# Patient Record
Sex: Female | Born: 1955 | Race: White | Hispanic: No | Smoking: Current every day smoker
Health system: Southern US, Community
[De-identification: ages and names within clinical notes are randomized; demographics above are authoritative.]

## PROBLEM LIST (undated history)

## (undated) DIAGNOSIS — E119 Type 2 diabetes mellitus without complications: Secondary | ICD-10-CM

## (undated) DIAGNOSIS — I10 Essential (primary) hypertension: Secondary | ICD-10-CM

## (undated) DIAGNOSIS — K219 Gastro-esophageal reflux disease without esophagitis: Secondary | ICD-10-CM

## (undated) DIAGNOSIS — N281 Cyst of kidney, acquired: Secondary | ICD-10-CM

## (undated) DIAGNOSIS — E049 Nontoxic goiter, unspecified: Secondary | ICD-10-CM

## (undated) DIAGNOSIS — N189 Chronic kidney disease, unspecified: Secondary | ICD-10-CM

## (undated) DIAGNOSIS — R0600 Dyspnea, unspecified: Secondary | ICD-10-CM

## (undated) DIAGNOSIS — C73 Malignant neoplasm of thyroid gland: Secondary | ICD-10-CM

## (undated) DIAGNOSIS — E039 Hypothyroidism, unspecified: Secondary | ICD-10-CM

## (undated) DIAGNOSIS — I509 Heart failure, unspecified: Secondary | ICD-10-CM

## (undated) DIAGNOSIS — J45909 Unspecified asthma, uncomplicated: Secondary | ICD-10-CM

## (undated) DIAGNOSIS — C55 Malignant neoplasm of uterus, part unspecified: Secondary | ICD-10-CM

## (undated) DIAGNOSIS — B029 Zoster without complications: Secondary | ICD-10-CM

## (undated) HISTORY — PX: COLONOSCOPY: SHX174

## (undated) HISTORY — PX: ABDOMINAL HYSTERECTOMY: SHX81

## (undated) HISTORY — PX: THYROIDECTOMY: SHX17

---

## 2006-09-17 ENCOUNTER — Ambulatory Visit: Payer: Self-pay | Admitting: Emergency Medicine

## 2010-02-17 ENCOUNTER — Ambulatory Visit: Payer: Self-pay | Admitting: Internal Medicine

## 2010-07-14 ENCOUNTER — Ambulatory Visit: Payer: Self-pay

## 2010-11-12 ENCOUNTER — Ambulatory Visit: Payer: Self-pay | Admitting: Family Medicine

## 2012-01-27 DIAGNOSIS — K219 Gastro-esophageal reflux disease without esophagitis: Secondary | ICD-10-CM | POA: Insufficient documentation

## 2012-01-27 DIAGNOSIS — I10 Essential (primary) hypertension: Secondary | ICD-10-CM

## 2012-01-27 DIAGNOSIS — Z720 Tobacco use: Secondary | ICD-10-CM | POA: Insufficient documentation

## 2012-02-29 DIAGNOSIS — E7800 Pure hypercholesterolemia, unspecified: Secondary | ICD-10-CM | POA: Insufficient documentation

## 2012-03-10 DIAGNOSIS — E041 Nontoxic single thyroid nodule: Secondary | ICD-10-CM | POA: Insufficient documentation

## 2012-08-08 ENCOUNTER — Ambulatory Visit: Payer: Self-pay | Admitting: Otolaryngology

## 2012-08-08 LAB — CREATININE, SERUM
Creatinine: 0.76 mg/dL (ref 0.60–1.30)
EGFR (African American): >60
EGFR (Non-African Amer.): >60

## 2014-12-10 ENCOUNTER — Ambulatory Visit
Admission: EM | Admit: 2014-12-10 | Discharge: 2014-12-10 | Disposition: A | Discharge status: Home / Self Care | Payer: BLUE CROSS/BLUE SHIELD | Attending: Family Medicine | Admitting: Family Medicine

## 2014-12-10 DIAGNOSIS — Z8542 Personal history of malignant neoplasm of other parts of uterus: Secondary | ICD-10-CM

## 2014-12-10 DIAGNOSIS — J45909 Unspecified asthma, uncomplicated: Secondary | ICD-10-CM

## 2014-12-10 DIAGNOSIS — J0100 Acute maxillary sinusitis, unspecified: Secondary | ICD-10-CM | POA: Diagnosis not present

## 2014-12-10 DIAGNOSIS — Z90710 Acquired absence of both cervix and uterus: Secondary | ICD-10-CM

## 2014-12-10 DIAGNOSIS — H6593 Unspecified nonsuppurative otitis media, bilateral: Secondary | ICD-10-CM | POA: Diagnosis not present

## 2014-12-10 DIAGNOSIS — E890 Postprocedural hypothyroidism: Secondary | ICD-10-CM

## 2014-12-10 DIAGNOSIS — I10 Essential (primary) hypertension: Secondary | ICD-10-CM

## 2014-12-10 DIAGNOSIS — Z98891 History of uterine scar from previous surgery: Secondary | ICD-10-CM

## 2014-12-10 DIAGNOSIS — J4540 Moderate persistent asthma, uncomplicated: Secondary | ICD-10-CM | POA: Insufficient documentation

## 2014-12-10 DIAGNOSIS — B370 Candidal stomatitis: Secondary | ICD-10-CM | POA: Diagnosis not present

## 2014-12-10 DIAGNOSIS — Z9009 Acquired absence of other part of head and neck: Secondary | ICD-10-CM

## 2014-12-10 HISTORY — DX: Malignant neoplasm of uterus, part unspecified: C55

## 2014-12-10 HISTORY — DX: Malignant neoplasm of thyroid gland: C73

## 2014-12-10 HISTORY — DX: Essential (primary) hypertension: I10

## 2014-12-10 LAB — RAPID STREP SCREEN (MED CTR MEBANE ONLY): Streptococcus, Group A Screen (Direct): NEGATIVE

## 2014-12-10 MED ORDER — FIRST-DUKES MOUTHWASH MT SUSP: 15.0000 mL | Freq: Three times a day (TID) | OROMUCOSAL | Status: DC | PRN

## 2014-12-10 MED ORDER — NYSTATIN 100000 UNIT/ML MT SUSP: 5.0000 mL | Freq: Four times a day (QID) | OROMUCOSAL | Status: DC

## 2014-12-10 NOTE — Discharge Instructions (Signed)
Pharyngitis Pharyngitis is redness, pain, and swelling (inflammation) of your pharynx.  CAUSES  Pharyngitis is usually caused by infection. Most of the time, these infections are from viruses (viral) and are part of a cold. However, sometimes pharyngitis is caused by bacteria (bacterial). Pharyngitis can also be caused by allergies. Viral pharyngitis may be spread from person to person by coughing, sneezing, and personal items or utensils (cups, forks, spoons, toothbrushes). Bacterial pharyngitis may be spread from person to person by more intimate contact, such as kissing.  SIGNS AND SYMPTOMS  Symptoms of pharyngitis include:  Sore throat.  Tiredness (fatigue).  Low-grade fever.  Headache. Joint pain and muscle aches. Skin rashes. Swollen lymph nodes. Plaque-like film on throat or tonsils (often seen with bacterial pharyngitis). DIAGNOSIS  Your health care provider will ask you questions about your illness and your symptoms. Your medical history, along with a physical exam, is often all that is needed to diagnose pharyngitis. Sometimes, a rapid strep test is done. Other lab tests may also be done, depending on the suspected cause.  TREATMENT  Viral pharyngitis will usually get better in 3-4 days without the use of medicine. Bacterial pharyngitis is treated with medicines that kill germs (antibiotics).  HOME CARE INSTRUCTIONS  Drink enough water and fluids to keep your urine clear or pale yellow.  Only take over-the-counter or prescription medicines as directed by your health care provider:  If you are prescribed antibiotics, make sure you finish them even if you start to feel better.  Do not take aspirin.  Get lots of rest.  Gargle with 8 oz of salt water ( tsp of salt per 1 qt of water) as often as every 1-2 hours to soothe your throat.  Throat lozenges (if you are not at risk for choking) or sprays may be used to soothe your throat. SEEK MEDICAL CARE IF:  You have large,  tender lumps in your neck. You have a rash. You cough up green, yellow-brown, or bloody spit. SEEK IMMEDIATE MEDICAL CARE IF:  Your neck becomes stiff. You drool or are unable to swallow liquids. You vomit or are unable to keep medicines or liquids down. You have severe pain that does not go away with the use of recommended medicines. You have trouble breathing (not caused by a stuffy nose). MAKE SURE YOU:  Understand these instructions. Will watch your condition. Will get help right away if you are not doing well or get worse. Document Released: 02/23/2005 Document Revised: 12/14/2012 Document Reviewed: 10/31/2012 [facility] Patient Information 2015 [place], [place]. This information is not intended to replace advice given to you by your health care provider. Make sure you discuss any questions you have with your health care provider. Metered Dose Inhaler (No Spacer Used) Inhaled medicines are the basis of treatment for asthma and other breathing problems. Inhaled medicine can only be effective if used properly. Good technique assures that the medicine reaches the lungs. Metered dose inhalers (MDIs) are used to deliver a variety of inhaled medicines. These include quick relief or rescue medicines (such as bronchodilators) and controller medicines (such as corticosteroids). The medicine is delivered by pushing down on a metal canister to release a set amount of spray. If you are using different kinds of inhalers, use your quick relief medicine to open the airways 10-15 minutes before using a steroid, if instructed to do so by your health care provider. If you are unsure which inhalers to use and the order of using them, ask your health care provider,  nurse, or respiratory therapist. HOW TO USE THE INHALER 1. Remove the cap from the inhaler. 2. If you are using the inhaler for the first time, you will need to prime it. Shake the inhaler for 5 seconds and release four puffs into the air, away from  your face. Ask your health care provider or pharmacist if you have questions about priming your inhaler. 3. Shake the inhaler for 5 seconds before each breath in (inhalation). 4. Position the inhaler so that the top of the canister faces up. 5. Put your index finger on the top of the medicine canister. Your thumb supports the bottom of the inhaler. 6. Open your mouth. 7. Either place the inhaler between your teeth and place your lips tightly around the mouthpiece, or hold the inhaler 1-2 inches away from your open mouth. If you are unsure of which technique to use, ask your health care provider. 8. Breathe out (exhale) normally and as completely as possible. 9. Press the canister down with the index finger to release the medicine. 10. At the same time as the canister is pressed, inhale deeply and slowly until your lungs are completely filled. This should take 4-6 seconds. Keep your tongue down. 11. Hold the medicine in your lungs for 5-10 seconds (10 seconds is best). This helps the medicine get into the small airways of your lungs. 12. Breathe out slowly, through pursed lips. Whistling is an example of pursed lips. 13. Wait at least 1 minute between puffs. Continue with the above steps until you have taken the number of puffs your health care provider has ordered. Do not use the inhaler more than your health care provider directs you to. 14. Replace the cap on the inhaler. 15. Follow the directions from your health care provider or the inhaler insert for cleaning the inhaler. If you are using a steroid inhaler, after your last puff, rinse your mouth with water, gargle, and spit out the water. Do not swallow the water. AVOID:  Inhaling before or after starting the spray of medicine. It takes practice to coordinate your breathing with triggering the spray.  Inhaling through the nose (rather than the mouth) when triggering the spray. HOW TO DETERMINE IF YOUR INHALER IS FULL OR NEARLY EMPTY You  cannot know when an inhaler is empty by shaking it. Some inhalers are now being made with dose counters. Ask your health care provider for a prescription that has a dose counter if you feel you need that extra help. If your inhaler does not have a counter, ask your health care provider to help you determine the date you need to refill your inhaler. Write the refill date on a calendar or your inhaler canister. Refill your inhaler 7-10 days before it runs out. Be sure to keep an adequate supply of medicine. This includes making sure it has not expired, and making sure you have a spare inhaler. SEEK MEDICAL CARE IF:  Symptoms are only partially relieved with your inhaler.  You are having trouble using your inhaler.  You experience an increase in phlegm. SEEK IMMEDIATE MEDICAL CARE IF:  You feel little or no relief with your inhalers. You are still wheezing and feeling shortness of breath, tightness in your chest, or both.  You have dizziness, headaches, or a fast heart rate.  You have chills, fever, or night sweats.  There is a noticeable increase in phlegm production, or there is blood in the phlegm. MAKE SURE YOU:  Understand these instructions.  Will watch  your condition.  Will get help right away if you are not doing well or get worse. Document Released: 12/21/2006 Document Revised: 07/10/2013 Document Reviewed: 08/11/2012 [facility] Patient Information 2015 [place], [place]. This information is not intended to replace advice given to you by your health care provider. Make sure you discuss any questions you have with your health care provider. Thrush, Adult  Ritta Slot, also called oral candidiasis, is a fungal infection that develops in the mouth and throat and on the tongue. It causes white patches to form on the mouth and tongue. Ritta Slot is most common in older adults, but it can occur at any age.  Many cases of thrush are mild, but this infection can also be more serious. Ritta Slot can be a  recurring problem for people who have chronic illnesses or who take medicines that limit the body's ability to fight infection. Because these people have difficulty fighting infections, the fungus that causes thrush can spread throughout the body. This can cause life-threatening blood or organ infections. CAUSES  Ritta Slot is usually caused by a yeast called Candida albicans. This fungus is normally present in small amounts in the mouth and on other mucous membranes. It usually causes no harm. However, when conditions are present that allow the fungus to grow uncontrolled, it invades surrounding tissues and becomes an infection. Less often, other Candida species can also lead to thrush.  RISK FACTORS Ritta Slot is more likely to develop in the following people: 62. People with an impaired ability to fight infection (weakened immune system).  64. Older adults.  36. People with HIV.  42. People with diabetes.  20. People with dry mouth (xerostomia).  67. Pregnant women.  23. People with poor dental care, especially those who have false teeth.  52. People who use antibiotic medicines.  SIGNS AND SYMPTOMS  Ritta Slot can be a mild infection that causes no symptoms. If symptoms develop, they may include:   A burning feeling in the mouth and throat. This can occur at the start of a thrush infection.   White patches that adhere to the mouth and tongue. The tissue around the patches may be red, raw, and painful. If rubbed (during tooth brushing, for example), the patches and the tissue of the mouth may bleed easily.   A bad taste in the mouth or difficulty tasting foods.   Cottony feeling in the mouth.   Pain during eating and swallowing. DIAGNOSIS  Your health care provider can usually diagnose thrush by looking in your mouth and asking you questions about your health.  TREATMENT  Medicines that help prevent the growth of fungi (antifungals) are the standard treatment for thrush. These  medicines are either applied directly to the affected area (topical) or swallowed (oral). The treatment will depend on the severity of the condition.  Mild Thrush Mild cases of thrush may clear up with the use of an antifungal mouth rinse or lozenges. Treatment usually lasts about 14 days.  Moderate to Severe Thrush  More severe thrush infections that have spread to the esophagus are treated with an oral antifungal medicine. A topical antifungal medicine may also be used.   For some severe infections, a treatment period longer than 14 days may be needed.   Oral antifungal medicines are almost never used during pregnancy because the fetus may be harmed. However, if a pregnant woman has a rare, severe thrush infection that has spread to her blood, oral antifungal medicines may be used. In this case, the risk of harm to  the mother and fetus from the severe thrush infection may be greater than the risk posed by the use of antifungal medicines.  Persistent or Recurrent Thrush For cases of thrush that do not go away or keep coming back, treatment may involve the following:   Treatment may be needed twice as long as the symptoms last.   Treatment will include both oral and topical antifungal medicines.   People with weakened immune systems can take an antifungal medicine on a continuous basis to prevent thrush infections.  It is important to treat conditions that make you more likely to get thrush, such as diabetes or HIV.  HOME CARE INSTRUCTIONS   Only take over-the-counter or prescription medicine as directed by your health care provider. Talk to your health care provider about an over-the-counter medicine called gentian violet, which kills bacteria and fungi.   Eat plain, unflavored yogurt as directed by your health care provider. Check the label to make sure the yogurt contains live cultures. This yogurt can help healthy bacteria grow in the mouth that can stop the growth of the fungus  that causes thrush.   Try these measures to help reduce the discomfort of thrush:   Drink cold liquids such as water or iced tea.   Try flavored ice treats or frozen juices.   Eat foods that are easy to swallow, such as gelatin, ice cream, or custard.   If the patches in your mouth are painful, try drinking from a straw.   Rinse your mouth several times a day with a warm saltwater rinse. You can make the saltwater mixture with 1 tsp (6 g) of salt in 8 fl oz (0.2 L) of warm water.   If you wear dentures, remove the dentures before going to bed, brush them vigorously, and soak them in a cleaning solution as directed by your health care provider.   Women who are breastfeeding should clean their nipples with an antifungal medicine as directed by their health care provider. Dry the nipples after breastfeeding. Applying lanolin-containing body lotion may help relieve nipple soreness.  SEEK MEDICAL CARE IF:  Your symptoms are getting worse or are not improving within 7 days of starting treatment.   You have symptoms of spreading infection, such as white patches on the skin outside of the mouth.   You are nursing and you have redness, burning, or pain in the nipples that is not relieved with treatment.  MAKE SURE YOU:  Understand these instructions.  Will watch your condition.  Will get help right away if you are not doing well or get worse. Document Released: 11/19/2003 Document Revised: 12/14/2012 Document Reviewed: 09/26/2012 [facility] Patient Information 2015 [place], [place]. This information is not intended to replace advice given to you by your health care provider. Make sure you discuss any questions you have with your health care provider.

## 2014-12-10 NOTE — ED Provider Notes (Signed)
CSN: [id number]     Arrival date & time 12/10/14  1646 History   First MD Initiated Contact with Patient 12/10/14 1731     Chief Complaint  Patient presents with  . Sore Throat  . Dry Mouth    (Consider location/radiation/quality/duration/timing/severity/associated sxs/prior Treatment) HPI Comments: Single caucasian female here for evaluation of mouth feeling fuzzy, sore throat, feeling hot, nausea since yesterday.  Started amoxicillin on Friday 30 Sep for tooth abscess and sinus infection from her dentist incisor left upper with purulent drainage and gums inflamed.  Next day productive cough started.  2 Oct fatigue/feeling tired, throat dry and sore cough now nonproductive, voice change recurrent.  Went to work today but still feeling bad scratchy throat here for evaluation.  Denied others sick at work or home.  Uses inhaler daily.  Patient is a 59 y.o. female presenting with pharyngitis. The history is provided by the patient.  Sore Throat This is a new problem. The current episode started more than 2 days ago. The problem occurs constantly. The problem has been gradually worsening. Associated symptoms include headaches. Pertinent negatives include no chest pain, no abdominal pain and no shortness of breath. The symptoms are aggravated by swallowing, eating, drinking and coughing. Nothing relieves the symptoms. She has tried acetaminophen, rest, food and water for the symptoms. The treatment provided no relief.    Past Medical History  Diagnosis Date  . Hypertension   . Thyroid cancer ([place])     in remission  . Uterine cancer ([place])     in remission   Past Surgical History  Procedure Laterality Date  . Thyroidectomy Right     partial  . Abdominal hysterectomy    . Cesarean section      x 2   Family History  Problem Relation Age of Onset  . Alzheimer's disease Mother   . Bone cancer Father    Social History  Substance Use Topics  . Smoking status: Former Smoker -- 1.00 packs/day  for 30 years    Quit date: 07/07/2014  . Smokeless tobacco: Never Used  . Alcohol Use: No   OB History    Gravida Para Term Preterm AB TAB SAB Ectopic Multiple Living   3 3             Review of Systems  Constitutional: Positive for fever. Negative for chills, diaphoresis, activity change, appetite change, fatigue and unexpected weight change.  HENT: Positive for congestion, ear pain, mouth sores, postnasal drip, sinus pressure, sore throat and voice change. Negative for dental problem, drooling, ear discharge, facial swelling, hearing loss, nosebleeds, rhinorrhea, sneezing, tinnitus and trouble swallowing.   Eyes: Negative for photophobia, pain, discharge, redness, itching and visual disturbance.  Respiratory: Positive for cough. Negative for choking, chest tightness, shortness of breath, wheezing and stridor.   Cardiovascular: Negative for chest pain, palpitations and leg swelling.  Gastrointestinal: Positive for nausea. Negative for vomiting, abdominal pain, diarrhea, constipation, blood in stool, abdominal distention and rectal pain.  Endocrine: Negative for cold intolerance and heat intolerance.  Genitourinary: Negative for dysuria.  Musculoskeletal: Negative for myalgias, back pain, joint swelling, arthralgias, gait problem, neck pain and neck stiffness.  Skin: Negative for color change, pallor, rash and wound.  Allergic/Immunologic: Positive for environmental allergies and immunocompromised state. Negative for food allergies.  Neurological: Positive for headaches. Negative for dizziness, tremors, seizures, syncope, facial asymmetry, speech difficulty, weakness, light-headedness and numbness.  Hematological: Negative for adenopathy. Does not bruise/bleed easily.  Psychiatric/Behavioral: Negative for behavioral problems,  confusion, sleep disturbance and agitation.    Allergies  Codeine  Home Medications   Prior to Admission medications   Medication Sig Start Date End Date  Taking? Authorizing Provider  albuterol (PROVENTIL HFA;VENTOLIN HFA) 108 (90 BASE) MCG/ACT inhaler Inhale into the lungs every 6 (six) hours as needed for wheezing or shortness of breath.   Yes Historical Provider, MD  atenolol (TENORMIN) 25 MG tablet Take by mouth daily.   Yes Historical Provider, MD  Cholecalciferol (VITAMIN D PO) Take by mouth.   Yes Historical Provider, MD  omeprazole (PRILOSEC) 20 MG capsule Take 20 mg by mouth daily.   Yes Historical Provider, MD  phentermine 15 MG capsule Take 15 mg by mouth every morning.   Yes Historical Provider, MD  topiramate (TOPAMAX) 50 MG tablet Take 50 mg by mouth 2 (two) times daily.   Yes Historical Provider, MD  triamterene-hydrochlorothiazide (MAXZIDE-25) 37.5-25 MG tablet Take 1 tablet by mouth daily.   Yes Historical Provider, MD  UNABLE TO FIND Med Name: METHEMAZOLE 10 MG   Yes Historical Provider, MD  vitamin B-12 (CYANOCOBALAMIN) 1000 MCG tablet Take 1,000 mcg by mouth daily.   Yes Historical Provider, MD  Diphenhyd-Hydrocort-Nystatin (FIRST-DUKES MOUTHWASH) SUSP Use as directed 15 mLs in the mouth or throat 3 (three) times daily as needed. 12/10/14   Olen Cordial, NP  nystatin (MYCOSTATIN) 100000 UNIT/ML suspension Take 5 mLs (500,000 Units total) by mouth 4 (four) times daily. 12/10/14   Olen Cordial, NP   Meds Ordered and Administered this Visit  Medications - No data to display  BP 103/73 mmHg  Pulse 66  Temp(Src) 97.8 F (36.6 C) (Tympanic)  Resp 16  Ht 5' 1.25" (1.556 m)  Wt 225 lb (102.059 kg)  BMI 42.15 kg/m2  SpO2 99% No data found.   Physical Exam  Constitutional: She is oriented to person, place, and time. Vital signs are normal. She appears well-developed and well-nourished. No distress.  HENT:  Head: Normocephalic and atraumatic.  Right Ear: Hearing, external ear and ear canal normal. A middle ear effusion is present.  Left Ear: Hearing, external ear and ear canal normal. A middle ear effusion is present.   Nose: Mucosal edema, rhinorrhea and sinus tenderness present. No nose lacerations, nasal deformity, septal deviation or nasal septal hematoma. No epistaxis.  No foreign bodies. Right sinus exhibits no maxillary sinus tenderness and no frontal sinus tenderness. Left sinus exhibits maxillary sinus tenderness. Left sinus exhibits no frontal sinus tenderness.  Mouth/Throat: Uvula is midline. Mucous membranes are not pale, dry and not cyanotic. She does not have dentures. Oral lesions present. No trismus in the jaw. Abnormal dentition. Dental abscesses present. No uvula swelling, lacerations or dental caries. Posterior oropharyngeal edema and posterior oropharyngeal erythema present. No oropharyngeal exudate or tonsillar abscesses.    Cobblestoning posterior pharynx; bilateral nasal turbinates with edema/erythema; keisselbach plexus excoriated right nares; bilateral TMs with air fluid level; tongue with white coating left lateral and tip; hard palate with white coating and erythema around edges along with bilateral tonsils 2+/4 edema/erythema; upper teeth most missing and left upper incisor with filling and discoloration adjacent teeth missing  Eyes: Conjunctivae, EOM and lids are normal. Pupils are equal, round, and reactive to light. Right eye exhibits no chemosis, no discharge, no exudate and no hordeolum. No foreign body present in the right eye. Left eye exhibits no chemosis, no discharge, no exudate and no hordeolum. No foreign body present in the left eye. No scleral icterus.  Neck:  Trachea normal and normal range of motion. Neck supple. No tracheal tenderness, no spinous process tenderness and no muscular tenderness present. No rigidity. No tracheal deviation, no edema, no erythema and normal range of motion present. No thyroid mass and no thyromegaly present.  Cardiovascular: Normal rate, regular rhythm, S1 normal, normal heart sounds and intact distal pulses.  PMI is not displaced.  Exam reveals no  gallop and no friction rub.   No murmur heard. Pulmonary/Chest: Effort normal and breath sounds normal. No accessory muscle usage or stridor. No respiratory distress. She has no decreased breath sounds. She has no wheezes. She has no rhonchi. She has no rales. She exhibits no tenderness.  Frequent cough nonproductive and productive clear sputum in tissue able to speak in whole sentences with laryngitis  Abdominal: Soft. Bowel sounds are normal. She exhibits no shifting dullness, no distension, no pulsatile liver, no fluid wave, no abdominal bruit, no ascites, no pulsatile midline mass and no mass. There is no hepatosplenomegaly. There is no tenderness. There is no rebound, no guarding and no CVA tenderness. Hernia confirmed negative in the ventral area.  Musculoskeletal: Normal range of motion. She exhibits no edema or tenderness.  Lymphadenopathy:       Head (right side): No submental, no submandibular, no tonsillar, no preauricular, no posterior auricular and no occipital adenopathy present.       Head (left side): No submental, no submandibular, no tonsillar, no preauricular, no posterior auricular and no occipital adenopathy present.    She has no cervical adenopathy.  Neurological: She is alert and oriented to person, place, and time. She displays no atrophy and no tremor. No cranial nerve deficit or sensory deficit. She exhibits normal muscle tone. She displays no seizure activity. Coordination and gait normal. GCS eye subscore is 4. GCS verbal subscore is 5. GCS motor subscore is 6.  Skin: Skin is warm, dry and intact. No abrasion, no bruising, no burn, no ecchymosis, no laceration, no lesion, no petechiae and no rash noted. She is not diaphoretic. No cyanosis or erythema. No pallor. Nails show no clubbing.  Psychiatric: She has a normal mood and affect. Her speech is normal and behavior is normal. Judgment and thought content normal. Cognition and memory are normal.  Nursing note and vitals  reviewed.   ED Course  Procedures (including critical care time)  Labs Review Labs Reviewed  RAPID STREP SCREEN (NOT AT [facility])  CULTURE, GROUP A STREP ([facility] ONLY)    Imaging Review No results found.  [place] Patient notified rapid strep negative. Patient verbalized understanding of information and had no further questions at this time.  MDM   1. Otitis media with effusion, bilateral   2. Acute maxillary sinusitis, recurrence not specified   3. Candida infection, oral    Supportive treatment.   No evidence of invasive bacterial infection, non toxic and well hydrated.  This is most likely self limiting viral infection.  I do not see where any further testing or imaging is necessary at this time.   I will suggest supportive care, rest, good hygiene and encourage the patient to take adequate fluids.  The patient is to return to clinic or EMERGENCY ROOM if symptoms worsen or change significantly e.g. ear pain, fever, purulent discharge from ears or bleeding.  Exitcare handout on otitis media with effusion given to patient.  Patient verbalized agreement and understanding of treatment plan.    Continue amoxicillin from dentist.  No evidence of systemic bacterial infection, non toxic and well  hydrated.  I do not see where any further testing or imaging is necessary at this time.   I will suggest supportive care, rest, good hygiene and encourage the patient to take adequate fluids.  The patient is to return to clinic or EMERGENCY ROOM if symptoms worsen or change significantly.  Exitcare handout on sinusitis given to patient.  Patient verbalized agreement and understanding of treatment plan and had no further questions at this time.   P2:  Hand washing and cover cough  Work excuse x 24 hours given to patient RTD 12 Dec 2014.  Throat culture results pending typically 48 hours will call once available if strep positive would require extension of amoxicillin therapy as dentist only gave her 5 days  treatment.  Discussed may also use [facility] mouthwash 63ml po TID gargle and swallow prior to eating.  Rx given. Supportive treatment.  Patient preferred liquid nystatin swish and swallow 500,000 QID po x 7 days.  Discussed with patient may require longer therapy or switch to oral azole if esophagus affected and chronic use of inhaled steroids.  Discussed proper mouth hygiene after MDI use of inhaled steroids and cleaning inhaler.  Exitcare handout on oral candidiasis given to patient.  Patient to follow up for re-evaluation if fever, worsening pain, hemoptysis, hematemesis, dysphagia, wheezing/dyspnea.  Patient verbalized understanding of information/instructions, agreed with plan of care and had no further questions at this time.   Olen Cordial, NP 12/10/14 [phone]

## 2014-12-10 NOTE — ED Notes (Signed)
Sore throat ax 5 days. Pt recently went to dentist for abscess tooth, and was put on Amoxicillin for sinusitis. Pt reports she is feeling worse today. Pt also c/o dry cough, post-nasal drainage.

## 2014-12-13 LAB — CULTURE, GROUP A STREP (THRC)

## 2014-12-14 ENCOUNTER — Ambulatory Visit
Admission: EM | Admit: 2014-12-14 | Discharge: 2014-12-14 | Disposition: A | Discharge status: Home / Self Care | Payer: BLUE CROSS/BLUE SHIELD | Attending: Family Medicine | Admitting: Family Medicine

## 2014-12-14 ENCOUNTER — Encounter: Payer: Self-pay | Admitting: Family Medicine

## 2014-12-14 ENCOUNTER — Telehealth: Payer: Self-pay | Admitting: Family Medicine

## 2014-12-14 ENCOUNTER — Encounter: Payer: Self-pay | Admitting: Emergency Medicine

## 2014-12-14 ENCOUNTER — Ambulatory Visit: Payer: BLUE CROSS/BLUE SHIELD

## 2014-12-14 DIAGNOSIS — M546 Pain in thoracic spine: Secondary | ICD-10-CM

## 2014-12-14 DIAGNOSIS — M549 Dorsalgia, unspecified: Secondary | ICD-10-CM

## 2014-12-14 MED ORDER — CYCLOBENZAPRINE HCL 10 MG PO TABS: 10.0000 mg | ORAL_TABLET | Freq: Every day | ORAL | Status: DC

## 2014-12-14 MED ORDER — KETOROLAC TROMETHAMINE 60 MG/2ML IM SOLN
60.0000 mg | Freq: Once | INTRAMUSCULAR | Status: AC
  Administered 2014-12-14: 60 mg via INTRAMUSCULAR

## 2014-12-14 MED ORDER — KETOROLAC TROMETHAMINE 10 MG PO TABS: 10.0000 mg | ORAL_TABLET | Freq: Three times a day (TID) | ORAL | Status: DC | PRN

## 2014-12-14 NOTE — ED Provider Notes (Signed)
CSN: [id number]     Arrival date & time 12/14/14  0828 History   First MD Initiated Contact with Patient 12/14/14 0913     Chief Complaint  Patient presents with  . Back Pain   (Consider location/radiation/quality/duration/timing/severity/associated sxs/prior Treatment) HPI Comments: 59 yo female with a 3-4 days h/o upper and mid back pain, associated with a cough and shortness of breath. Denies any injury, falls, fevers, chills. States pain is worse with deep breathing. Patient is a former smoker (>30 pack year history) who quit in April of this year.   Patient is a 59 y.o. female presenting with back pain. The history is provided by the patient.  Back Pain   Past Medical History  Diagnosis Date  . Hypertension   . Thyroid cancer ([place])     in remission  . Uterine cancer ([place])     in remission   Past Surgical History  Procedure Laterality Date  . Thyroidectomy Right     partial  . Abdominal hysterectomy    . Cesarean section      x 2   Family History  Problem Relation Age of Onset  . Alzheimer's disease Mother   . Bone cancer Father    Social History  Substance Use Topics  . Smoking status: Former Smoker -- 1.00 packs/day for 30 years    Quit date: 07/07/2014  . Smokeless tobacco: Never Used  . Alcohol Use: No   OB History    Gravida Para Term Preterm AB TAB SAB Ectopic Multiple Living   3 3             Review of Systems  Musculoskeletal: Positive for back pain.    Allergies  Codeine  Home Medications   Prior to Admission medications   Medication Sig Start Date End Date Taking? Authorizing Provider  albuterol (PROVENTIL HFA;VENTOLIN HFA) 108 (90 BASE) MCG/ACT inhaler Inhale into the lungs every 6 (six) hours as needed for wheezing or shortness of breath.    Historical Provider, MD  atenolol (TENORMIN) 25 MG tablet Take by mouth daily.    Historical Provider, MD  Cholecalciferol (VITAMIN D PO) Take by mouth.    Historical Provider, MD  cyclobenzaprine  (FLEXERIL) 10 MG tablet Take 1 tablet (10 mg total) by mouth at bedtime. prn 12/14/14   Norval Gable, MD  Diphenhyd-Hydrocort-Nystatin (FIRST-DUKES MOUTHWASH) SUSP Use as directed 15 mLs in the mouth or throat 3 (three) times daily as needed. 12/10/14   Olen Cordial, NP  ketorolac (TORADOL) 10 MG tablet Take 1 tablet (10 mg total) by mouth every 8 (eight) hours as needed. 12/14/14   Norval Gable, MD  nystatin (MYCOSTATIN) 100000 UNIT/ML suspension Take 5 mLs (500,000 Units total) by mouth 4 (four) times daily. 12/10/14   Olen Cordial, NP  omeprazole (PRILOSEC) 20 MG capsule Take 20 mg by mouth daily.    Historical Provider, MD  phentermine 15 MG capsule Take 15 mg by mouth every morning.    Historical Provider, MD  topiramate (TOPAMAX) 50 MG tablet Take 50 mg by mouth 2 (two) times daily.    Historical Provider, MD  triamterene-hydrochlorothiazide (MAXZIDE-25) 37.5-25 MG tablet Take 1 tablet by mouth daily.    Historical Provider, MD  UNABLE TO FIND Med Name: METHEMAZOLE 10 MG    Historical Provider, MD  vitamin B-12 (CYANOCOBALAMIN) 1000 MCG tablet Take 1,000 mcg by mouth daily.    Historical Provider, MD   Meds Ordered and Administered this Visit   Medications  ketorolac (TORADOL) injection 60 mg (60 mg Intramuscular Given 12/14/14 0932)    BP 114/58 mmHg  Pulse 64  Temp(Src) 98.6 F (37 C) (Tympanic)  Resp 20  Ht 5' 3.5" (1.613 m)  Wt 223 lb (101.152 kg)  BMI 38.88 kg/m2  SpO2 98% No data found.   Physical Exam  Constitutional: She appears well-developed and well-nourished. No distress.  HENT:  Head: Normocephalic and atraumatic.  Right Ear: Tympanic membrane, external ear and ear canal normal.  Left Ear: Tympanic membrane, external ear and ear canal normal.  Nose: No nose lacerations, sinus tenderness, nasal deformity, septal deviation or nasal septal hematoma. No epistaxis.  No foreign bodies.  Mouth/Throat: Uvula is midline, oropharynx is clear and moist and mucous  membranes are normal. No oropharyngeal exudate.  Eyes: Conjunctivae and EOM are normal. Pupils are equal, round, and reactive to light. Right eye exhibits no discharge. Left eye exhibits no discharge. No scleral icterus.  Neck: Normal range of motion. Neck supple. No thyromegaly present.  Cardiovascular: Normal rate, regular rhythm and normal heart sounds.   Pulmonary/Chest: Effort normal. No respiratory distress. She has wheezes.  Few diffuse wheezes bilaterally and rhonchi left lung  Musculoskeletal: She exhibits tenderness.  Tenderness to thoracic paraspinous muscles bilaterally  Lymphadenopathy:    She has no cervical adenopathy.  Skin: No rash noted. She is not diaphoretic.  Nursing note and vitals reviewed.   ED Course  Procedures (including critical care time)  Labs Review Labs Reviewed - No data to display  Imaging Review Dg Chest 2 View  12/14/2014   CLINICAL DATA:  Shortness of breath, productive cough, chest tightness, former smoking history  EXAM: CHEST  2 VIEW  COMPARISON:  None.  FINDINGS: No active infiltrate or effusion is seen. Mediastinal and hilar contours are unremarkable. The heart is within normal limits in size. A small focus of sclerosis is noted overlying the anterior left seventh rib of questionable significance possibly simply representing a bone island. Comparison with any prior chest x-ray is recommended.  IMPRESSION: 1. No active cardiopulmonary disease. 2. 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 Compare with any prior chest x-ray or followup chest x-ray.   Electronically Signed   By: Ivar Drape M.D.   On: 12/14/2014 10:33     Visual Acuity Review  Right Eye Distance:   Left Eye Distance:   Bilateral Distance:    Right Eye Near:   Left Eye Near:    Bilateral Near:         MDM   1. Upper back pain    Discharge Medication List as of 12/14/2014 10:58 AM    START taking these medications    Details  cyclobenzaprine (FLEXERIL) 10 MG tablet Take 1 tablet (10 mg total) by mouth at bedtime. prn, Starting 12/14/2014, Until Discontinued, Normal    ketorolac (TORADOL) 10 MG tablet Take 1 tablet (10 mg total) by mouth every 8 (eight) hours as needed., Starting 12/14/2014, Until Discontinued, Normal      Plan: 1. x-ray results and diagnosis reviewed with patient; discussed sclerosis noted around the left seventh rib and recommendation for follow up CXR with PCP 2. rx as per orders above;  benefits, risks, potential side effects reviewed  3. Recommend supportive treatment with heat to affected area and gentle stretching 4. Patient given Toradol 60mg  IM x 1 with improvement of symptoms 5. Follow up prn if symptoms worsen or are not improving    Norval Gable, MD  12/14/14 1424 

## 2014-12-14 NOTE — Telephone Encounter (Signed)
Patient notified throat culture normal/negative.  Patient reported throat pain resolved with nystatin swish and swallow.  Patient verbalized understanding of information and had no further questions at this time.

## 2014-12-14 NOTE — ED Notes (Signed)
Pt with back pain after heavy coughingx days

## 2015-11-10 ENCOUNTER — Ambulatory Visit
Admission: EM | Admit: 2015-11-10 | Discharge: 2015-11-10 | Disposition: A | Discharge status: Home / Self Care | Payer: BLUE CROSS/BLUE SHIELD | Attending: Family Medicine | Admitting: Family Medicine

## 2015-11-10 DIAGNOSIS — L03311 Cellulitis of abdominal wall: Secondary | ICD-10-CM

## 2015-11-10 MED ORDER — SULFAMETHOXAZOLE-TRIMETHOPRIM 800-160 MG PO TABS: 1.0000 | ORAL_TABLET | Freq: Two times a day (BID) | ORAL | 0 refills | Status: DC

## 2015-11-10 NOTE — ED Provider Notes (Signed)
[facility]    CSN: [id number] Arrival date & time: 11/10/15  1116  First Provider Contact:  None       History   Chief Complaint Chief Complaint  Patient presents with  . Abscess    HPI Diana Dillon is a 60 y.o. female.   60 yo female with a 5 day h/o skin lesion on lower abdomen which has turned into a skin ulcer with surrounding, spreading redness; slight drainage from ulcer. Denies any fevers, chills.    The history is provided by the patient.    Past Medical History:  Diagnosis Date  . Hypertension   . Thyroid cancer ([place])    in remission  . Uterine cancer ([place])    in remission    Patient Active Problem List   Diagnosis Date Noted  . H/O thyroidectomy 12/10/2014  . History of uterine cancer 12/10/2014  . H/O: hysterectomy 12/10/2014  . H/O: C-section 12/10/2014  . Asthma 12/10/2014  . Hypertension 12/10/2014    Past Surgical History:  Procedure Laterality Date  . ABDOMINAL HYSTERECTOMY    . CESAREAN SECTION     x 2  . THYROIDECTOMY Right    partial    OB History    Gravida Para Term Preterm AB Living   3 3           SAB TAB Ectopic Multiple Live Births                   Home Medications    Prior to Admission medications   Medication Sig Start Date End Date Taking? Authorizing Provider  albuterol (PROVENTIL HFA;VENTOLIN HFA) 108 (90 BASE) MCG/ACT inhaler Inhale into the lungs every 6 (six) hours as needed for wheezing or shortness of breath.   Yes Historical Provider, MD  atenolol (TENORMIN) 25 MG tablet Take by mouth daily.   Yes Historical Provider, MD  Cholecalciferol (VITAMIN D PO) Take by mouth.   Yes Historical Provider, MD  omeprazole (PRILOSEC) 20 MG capsule Take 20 mg by mouth daily.   Yes Historical Provider, MD  phentermine 15 MG capsule Take 15 mg by mouth every morning.   Yes Historical Provider, MD  topiramate (TOPAMAX) 50 MG tablet Take 50 mg by mouth 2 (two) times daily.   Yes Historical Provider, MD    triamterene-hydrochlorothiazide (MAXZIDE-25) 37.5-25 MG tablet Take 1 tablet by mouth daily.   Yes Historical Provider, MD  UNABLE TO FIND Med Name: METHEMAZOLE 10 MG   Yes Historical Provider, MD  vitamin B-12 (CYANOCOBALAMIN) 1000 MCG tablet Take 1,000 mcg by mouth daily.   Yes Historical Provider, MD  cyclobenzaprine (FLEXERIL) 10 MG tablet Take 1 tablet (10 mg total) by mouth at bedtime. prn 12/14/14   Norval Gable, MD  Diphenhyd-Hydrocort-Nystatin (FIRST-DUKES MOUTHWASH) SUSP Use as directed 15 mLs in the mouth or throat 3 (three) times daily as needed. 12/10/14   Olen Cordial, NP  ketorolac (TORADOL) 10 MG tablet Take 1 tablet (10 mg total) by mouth every 8 (eight) hours as needed. 12/14/14   Norval Gable, MD  nystatin (MYCOSTATIN) 100000 UNIT/ML suspension Take 5 mLs (500,000 Units total) by mouth 4 (four) times daily. 12/10/14   Olen Cordial, NP  sulfamethoxazole-trimethoprim (BACTRIM DS,SEPTRA DS) 800-160 MG tablet Take 1 tablet by mouth 2 (two) times daily. 11/10/15   Norval Gable, MD    Family History Family History  Problem Relation Age of Onset  . Alzheimer's disease Mother   . Bone cancer Father  Social History Social History  Substance Use Topics  . Smoking status: Former Smoker    Packs/day: 1.00    Years: 30.00    Quit date: 07/07/2014  . Smokeless tobacco: Never Used  . Alcohol use Yes     Comment: occasionally     Allergies   Codeine   Review of Systems Review of Systems   Physical Exam Triage Vital Signs ED Triage Vitals  Enc Vitals Group     BP --      Pulse Rate 11/10/15 1229 60     Resp --      Temp 11/10/15 1229 97.2 F (36.2 C)     Temp Source 11/10/15 1229 Tympanic     SpO2 11/10/15 1229 100 %     Weight 11/10/15 1227 225 lb (102.1 kg)     Height 11/10/15 1227 5' 1.25" (1.556 m)     Head Circumference --      Peak Flow --      Pain Score 11/10/15 1231 2     Pain Loc --      Pain Edu? --      Excl. in [place]? --    No data  found.   Updated Vital Signs Pulse 60   Temp 97.2 F (36.2 C) (Tympanic)   Ht 5' 1.25" (1.556 m)   Wt 225 lb (102.1 kg)   SpO2 100%   BMI 42.17 kg/m   Visual Acuity Right Eye Distance:   Left Eye Distance:   Bilateral Distance:    Right Eye Near:   Left Eye Near:    Bilateral Near:     Physical Exam  Constitutional: She appears well-developed and well-nourished. No distress.  Skin: She is not diaphoretic. There is erythema.     Approximately 5x6cm lower abdominal skin area with warmth, blanchable erythema, tenderness to palpation and central 32mm superficial skin ulceration with minimal serous drainage  Nursing note and vitals reviewed.    UC Treatments / Results  Labs (all labs ordered are listed, but only abnormal results are displayed) Labs Reviewed  AEROBIC CULTURE (SUPERFICIAL SPECIMEN)    EKG  EKG Interpretation None       Radiology No results found.  Procedures Procedures (including critical care time)  Medications Ordered in UC Medications - No data to display   Initial Impression / Assessment and Plan / UC Course  I have reviewed the triage vital signs and the nursing notes.  Pertinent labs & imaging results that were available during my care of the patient were reviewed by me and considered in my medical decision making (see chart for details).  Clinical Course      Final Clinical Impressions(s) / UC Diagnoses   Final diagnoses:  Cellulitis of abdominal wall    New Prescriptions Discharge Medication List as of 11/10/2015  1:52 PM     1. diagnosis reviewed with patient 2. rx as per orders above; reviewed possible side effects, interactions, risks and benefits (rx for Bactrim DS as per orders) 3. Recommend supportive treatment with warm compresses to area 4. Follow-up prn if symptoms worsen or don't improve   Norval Gable, MD 11/10/15 [phone]

## 2015-11-10 NOTE — ED Triage Notes (Signed)
Patient has an abdominal abscess that started around 4 days ago with redness and drainage. Patient states that she has a thyroidectomy scheduled for 11/22/2015. Patient states that she has been having sweats but, no fevers.

## 2015-11-14 LAB — AEROBIC CULTURE W GRAM STAIN (SUPERFICIAL SPECIMEN)
Gram Stain: NONE SEEN
Special Requests: NORMAL

## 2016-01-23 DIAGNOSIS — R7303 Prediabetes: Secondary | ICD-10-CM | POA: Insufficient documentation

## 2016-05-24 ENCOUNTER — Ambulatory Visit
Admission: EM | Admit: 2016-05-24 | Discharge: 2016-05-24 | Disposition: A | Discharge status: Home / Self Care | Payer: BLUE CROSS/BLUE SHIELD | Attending: Family Medicine | Admitting: Family Medicine

## 2016-05-24 DIAGNOSIS — R21 Rash and other nonspecific skin eruption: Secondary | ICD-10-CM | POA: Diagnosis not present

## 2016-05-24 MED ORDER — TRIAMCINOLONE ACETONIDE 0.1 % EX OINT: 1.0000 "application " | TOPICAL_OINTMENT | Freq: Two times a day (BID) | CUTANEOUS | 0 refills | Status: DC

## 2016-05-24 MED ORDER — MUPIROCIN 2 % EX OINT: 1.0000 "application " | TOPICAL_OINTMENT | Freq: Two times a day (BID) | CUTANEOUS | 0 refills | Status: DC

## 2016-05-24 NOTE — Discharge Instructions (Signed)
Topical agents as prescribed.  Take care  Dr. Lacinda Axon

## 2016-05-24 NOTE — ED Triage Notes (Signed)
Patient complains of rash that started on her right antecubital area. Patient states that rash is burning and itchy. Patient states that she had shingles a few years ago on right mandible. Patient states that area feels similar.

## 2016-05-24 NOTE — ED Provider Notes (Signed)
[facility]    CSN: [id number] Arrival date & time: 05/24/16  0808  History   Chief Complaint Chief Complaint  Patient presents with  . Rash   HPI  61 year old female presents with complaints of rash.  Patient states that on Thursday she developed a circular skin lesion on her right forearm. She does not recall a bite. No new exposures. She subsequently had additional papules appear. She reports associated burning and itching. She states that they have looked vesicular. Associated redness. She is concerned about shingles that she's had this in the past. No known exacerbating or relieving factors. No other associated symptoms. No other complaints or concerns at this time.  Past Medical History:  Diagnosis Date  . Hypertension   . Thyroid cancer ([place])    in remission  . Uterine cancer ([place])    in remission   Patient Active Problem List   Diagnosis Date Noted  . H/O thyroidectomy 12/10/2014  . History of uterine cancer 12/10/2014  . H/O: hysterectomy 12/10/2014  . H/O: C-section 12/10/2014  . Asthma 12/10/2014  . Hypertension 12/10/2014   Past Surgical History:  Procedure Laterality Date  . ABDOMINAL HYSTERECTOMY    . CESAREAN SECTION     x 2  . THYROIDECTOMY Right    partial   OB History    Gravida Para Term Preterm AB Living   3 3           SAB TAB Ectopic Multiple Live Births                 Home Medications    Prior to Admission medications   Medication Sig Start Date End Date Taking? Authorizing Provider  albuterol (PROVENTIL HFA;VENTOLIN HFA) 108 (90 BASE) MCG/ACT inhaler Inhale into the lungs every 6 (six) hours as needed for wheezing or shortness of breath.   Yes Historical Provider, MD  atenolol (TENORMIN) 25 MG tablet Take by mouth daily.   Yes Historical Provider, MD  Cholecalciferol (VITAMIN D PO) Take by mouth.   Yes Historical Provider, MD  levothyroxine (SYNTHROID, LEVOTHROID) 75 MCG tablet Take 75 mcg by mouth daily before breakfast.    Yes Historical Provider, MD  omeprazole (PRILOSEC) 20 MG capsule Take 20 mg by mouth daily.   Yes Historical Provider, MD  phentermine 15 MG capsule Take 15 mg by mouth every morning.   Yes Historical Provider, MD  topiramate (TOPAMAX) 50 MG tablet Take 50 mg by mouth 2 (two) times daily.   Yes Historical Provider, MD  triamterene-hydrochlorothiazide (MAXZIDE-25) 37.5-25 MG tablet Take 1 tablet by mouth daily.   Yes Historical Provider, MD  vitamin B-12 (CYANOCOBALAMIN) 1000 MCG tablet Take 1,000 mcg by mouth daily.   Yes Historical Provider, MD  cyclobenzaprine (FLEXERIL) 10 MG tablet Take 1 tablet (10 mg total) by mouth at bedtime. prn 12/14/14   Norval Gable, MD  Diphenhyd-Hydrocort-Nystatin (FIRST-DUKES MOUTHWASH) SUSP Use as directed 15 mLs in the mouth or throat 3 (three) times daily as needed. 12/10/14   Olen Cordial, NP  ketorolac (TORADOL) 10 MG tablet Take 1 tablet (10 mg total) by mouth every 8 (eight) hours as needed. 12/14/14   Norval Gable, MD  mupirocin ointment (BACTROBAN) 2 % Place 1 application into the nose 2 (two) times daily. 05/24/16   Coral Spikes, DO  nystatin (MYCOSTATIN) 100000 UNIT/ML suspension Take 5 mLs (500,000 Units total) by mouth 4 (four) times daily. 12/10/14   Olen Cordial, NP  sulfamethoxazole-trimethoprim (BACTRIM DS,SEPTRA DS) 800-160  MG tablet Take 1 tablet by mouth 2 (two) times daily. 11/10/15   Norval Gable, MD  triamcinolone ointment (KENALOG) 0.1 % Apply 1 application topically 2 (two) times daily. 05/24/16   Coral Spikes, DO  UNABLE TO FIND Med Name: METHEMAZOLE 10 MG    Historical Provider, MD    Family History Family History  Problem Relation Age of Onset  . Alzheimer's disease Mother   . Bone cancer Father     Social History Social History  Substance Use Topics  . Smoking status: Former Smoker    Packs/day: 1.00    Years: 30.00    Quit date: 07/07/2014  . Smokeless tobacco: Never Used  . Alcohol use Yes     Comment: occasionally      Allergies   Codeine   Review of Systems Review of Systems  Constitutional: Negative.   Skin: Positive for rash.   Physical Exam Triage Vital Signs ED Triage Vitals  Enc Vitals Group     BP 05/24/16 0838 113/60     Pulse Rate 05/24/16 0838 64     Resp 05/24/16 0838 17     Temp 05/24/16 0838 97.8 F (36.6 C)     Temp Source 05/24/16 0838 Oral     SpO2 05/24/16 0838 99 %     Weight 05/24/16 0835 225 lb (102.1 kg)     Height 05/24/16 0835 5' 1.25" (1.556 m)     Head Circumference --      Peak Flow --      Pain Score 05/24/16 0837 7     Pain Loc --      Pain Edu? --      Excl. in [place]? --    Updated Vital Signs BP 113/60 (BP Location: Left Arm)   Pulse 64   Temp 97.8 F (36.6 C) (Oral)   Resp 17   Ht 5' 1.25" (1.556 m)   Wt 225 lb (102.1 kg)   SpO2 99%   BMI 42.17 kg/m   Physical Exam  Constitutional: She is oriented to person, place, and time. She appears well-developed. No distress.  Cardiovascular: Normal rate.   Pulmonary/Chest: Effort normal and breath sounds normal.  Neurological: She is alert and oriented to person, place, and time.  Skin:  Right forearm (ventral side) - patient has several raised erythemas papules. They appear to have started as vesicles and have ruptured. No current drainage or fluctuance.  Psychiatric: She has a normal mood and affect.  Vitals reviewed.    UC Treatments / Results  Labs (all labs ordered are listed, but only abnormal results are displayed) Labs Reviewed - No data to display  EKG  EKG Interpretation None       Radiology No results found.  Procedures Procedures (including critical care time)  Medications Ordered in UC Medications - No data to display   Initial Impression / Assessment and Plan / UC Course  I have reviewed the triage vital signs and the nursing notes.  Pertinent labs & imaging results that were available during my care of the patient were reviewed by me and considered in my medical  decision making (see chart for details).    62 year old female presents with rash. Likely contact in nature. Treating with topical steroid and topical antibiotic.   Final Clinical Impressions(s) / UC Diagnoses   Final diagnoses:  Rash    New Prescriptions New Prescriptions   MUPIROCIN OINTMENT (BACTROBAN) 2 %    Place 1 application into the nose  2 (two) times daily.   TRIAMCINOLONE OINTMENT (KENALOG) 0.1 %    Apply 1 application topically 2 (two) times daily.     Coral Spikes, DO 05/24/16 [phone]

## 2017-11-02 DIAGNOSIS — E559 Vitamin D deficiency, unspecified: Secondary | ICD-10-CM | POA: Insufficient documentation

## 2018-04-23 ENCOUNTER — Ambulatory Visit
Admission: EM | Admit: 2018-04-23 | Discharge: 2018-04-23 | Disposition: A | Discharge status: Home / Self Care | Payer: BLUE CROSS/BLUE SHIELD | Attending: Emergency Medicine | Admitting: Emergency Medicine

## 2018-04-23 ENCOUNTER — Encounter: Payer: Self-pay | Admitting: Emergency Medicine

## 2018-04-23 ENCOUNTER — Other Ambulatory Visit: Payer: Self-pay

## 2018-04-23 ENCOUNTER — Ambulatory Visit (INDEPENDENT_AMBULATORY_CARE_PROVIDER_SITE_OTHER): Payer: BLUE CROSS/BLUE SHIELD

## 2018-04-23 DIAGNOSIS — J4521 Mild intermittent asthma with (acute) exacerbation: Secondary | ICD-10-CM

## 2018-04-23 DIAGNOSIS — Z720 Tobacco use: Secondary | ICD-10-CM

## 2018-04-23 MED ORDER — PREDNISONE 20 MG PO TABS: ORAL_TABLET | ORAL | 0 refills | Status: DC

## 2018-04-23 MED ORDER — DOXYCYCLINE HYCLATE 100 MG PO CAPS: 100.0000 mg | ORAL_CAPSULE | Freq: Two times a day (BID) | ORAL | 0 refills | Status: DC

## 2018-04-23 MED ORDER — IPRATROPIUM-ALBUTEROL 0.5-2.5 (3) MG/3ML IN SOLN
3.0000 mL | Freq: Once | RESPIRATORY_TRACT | Status: AC
  Administered 2018-04-23: 3 mL via RESPIRATORY_TRACT

## 2018-04-23 MED ORDER — BENZONATATE 200 MG PO CAPS: ORAL_CAPSULE | ORAL | 0 refills | Status: DC

## 2018-04-23 MED ORDER — PSEUDOEPH-BROMPHEN-DM 30-2-10 MG/5ML PO SYRP: 5.0000 mL | ORAL_SOLUTION | Freq: Four times a day (QID) | ORAL | 0 refills | Status: DC | PRN

## 2018-04-23 NOTE — ED Triage Notes (Signed)
Patient c/o cough, congestion, HAs, diarrhea and bodyaches that started Tuesday night. Patient denies fevers.

## 2018-04-23 NOTE — ED Provider Notes (Signed)
[facility]    CSN: [id number] Arrival date & time: 04/23/18  7121     History   Chief Complaint Chief Complaint  Patient presents with  . Cough  . Generalized Body Aches  . Diarrhea    HPI Diana Dillon is a 63 y.o. female.   HPI  63 year old female history of asthma currently every day smoker presents with cough headache body aches and diarrhea that started Tuesday night 4 days prior to this visit.  Had no fevers but has feelings of being hot at times.  Been using her Symbicort inhaler as well as the albuterol inhaler more frequently recently.  She is having some shortness of breath and has noticeable wheezing present.  O2 sats on room air 97% respirations are 16.  She is afebrile and a pulse rate of 63.  Does state that the diarrhea has slowed down quite a bit and is improving         Past Medical History:  Diagnosis Date  . Hypertension   . Thyroid cancer ([place])    in remission  . Uterine cancer ([place])    in remission    Patient Active Problem List   Diagnosis Date Noted  . H/O thyroidectomy 12/10/2014  . History of uterine cancer 12/10/2014  . H/O: hysterectomy 12/10/2014  . H/O: C-section 12/10/2014  . Asthma 12/10/2014  . Hypertension 12/10/2014    Past Surgical History:  Procedure Laterality Date  . ABDOMINAL HYSTERECTOMY    . CESAREAN SECTION     x 2  . THYROIDECTOMY Right    partial    OB History    Gravida  3   Para  3   Term      Preterm      AB      Living        SAB      TAB      Ectopic      Multiple      Live Births               Home Medications    Prior to Admission medications   Medication Sig Start Date End Date Taking? Authorizing Provider  atenolol (TENORMIN) 25 MG tablet Take by mouth daily.   Yes [provider]  Cholecalciferol (VITAMIN D PO) Take by mouth.   Yes [provider]  levothyroxine (SYNTHROID, LEVOTHROID) 75 MCG tablet Take 75 mcg by mouth daily before  breakfast.   Yes [provider]  omeprazole (PRILOSEC) 20 MG capsule Take 20 mg by mouth daily.   Yes [provider]  triamterene-hydrochlorothiazide (MAXZIDE-25) 37.5-25 MG tablet Take 1 tablet by mouth daily.   Yes [provider]  vitamin B-12 (CYANOCOBALAMIN) 1000 MCG tablet Take 1,000 mcg by mouth daily.   Yes [provider]  albuterol (PROVENTIL HFA;VENTOLIN HFA) 108 (90 BASE) MCG/ACT inhaler Inhale into the lungs every 6 (six) hours as needed for wheezing or shortness of breath.    [provider]  benzonatate (TESSALON) 200 MG capsule Take one cap TID PRN cough 04/23/18   Lorin Picket, PA-C  brompheniramine-pseudoephedrine-DM 30-2-10 MG/5ML syrup Take 5 mLs by mouth 4 (four) times daily as needed. Take for cough 04/23/18   Crecencio Mc P, PA-C  doxycycline (VIBRAMYCIN) 100 MG capsule Take 1 capsule (100 mg total) by mouth 2 (two) times daily. 04/23/18   Lorin Picket, PA-C  mupirocin ointment (BACTROBAN) 2 % Place 1 application into the nose 2 (two)  times daily. 05/24/16   Coral Spikes, DO  nystatin (MYCOSTATIN) 100000 UNIT/ML suspension Take 5 mLs (500,000 Units total) by mouth 4 (four) times daily. 12/10/14   Betancourt, Aura Fey, NP  predniSONE (DELTASONE) 20 MG tablet Take 2 tablets (40 mg) daily by mouth 04/23/18   Lorin Picket, PA-C  sulfamethoxazole-trimethoprim (BACTRIM DS,SEPTRA DS) 800-160 MG tablet Take 1 tablet by mouth 2 (two) times daily. 11/10/15   Norval Gable, MD  triamcinolone ointment (KENALOG) 0.1 % Apply 1 application topically 2 (two) times daily. 05/24/16   Coral Spikes, DO  UNABLE TO FIND Med Name: METHEMAZOLE 10 MG    [provider]    Family History Family History  Problem Relation Age of Onset  . Alzheimer's disease Mother   . Bone cancer Father     Social History Social History   Tobacco Use  . Smoking status: Current Every Day Smoker    Packs/day: 1.00    Years: 30.00    Pack years:  30.00    Last attempt to quit: 07/07/2014    Years since quitting: 3.7  . Smokeless tobacco: Never Used  Substance Use Topics  . Alcohol use: Yes    Comment: occasionally  . Drug use: No     Allergies   Codeine   Review of Systems Review of Systems  Constitutional: Positive for activity change, fatigue and fever.  HENT: Positive for congestion, postnasal drip and rhinorrhea.   Respiratory: Positive for cough, shortness of breath and wheezing.   All other systems reviewed and are negative.    Physical Exam Triage Vital Signs ED Triage Vitals  Enc Vitals Group     BP 04/23/18 1005 119/81     Pulse Rate 04/23/18 1005 63     Resp 04/23/18 1005 16     Temp 04/23/18 1005 98.2 F (36.8 C)     Temp Source 04/23/18 1005 Oral     SpO2 04/23/18 1005 97 %     Weight 04/23/18 1001 215 lb (97.5 kg)     Height 04/23/18 1001 5\' 1"  (1.549 m)     Head Circumference --      Peak Flow --      Pain Score 04/23/18 1001 2     Pain Loc --      Pain Edu? --      Excl. in [place]? --    No data found.  Updated Vital Signs BP 119/81 (BP Location: Right Arm)   Pulse 66   Temp 98.2 F (36.8 C) (Oral)   Resp 16   Ht 5\' 1"  (1.549 m)   Wt 215 lb (97.5 kg)   SpO2 99%   BMI 40.62 kg/m   Visual Acuity Right Eye Distance:   Left Eye Distance:   Bilateral Distance:    Right Eye Near:   Left Eye Near:    Bilateral Near:     Physical Exam Vitals signs and nursing note reviewed.  Constitutional:      General: She is not in acute distress.    Appearance: Normal appearance. She is not ill-appearing, toxic-appearing or diaphoretic.  HENT:     Head: Normocephalic and atraumatic.     Right Ear: Tympanic membrane, ear canal and external ear normal.     Left Ear: Tympanic membrane, ear canal and external ear normal.     Nose: Congestion and rhinorrhea present.     Mouth/Throat:     Mouth: Mucous membranes are moist.  Pharynx: Oropharynx is clear.  Eyes:     General:        Right eye:  No discharge.        Left eye: No discharge.     Conjunctiva/sclera: Conjunctivae normal.  Neck:     Musculoskeletal: Normal range of motion and neck supple.  Pulmonary:     Effort: Pulmonary effort is normal.     Breath sounds: Wheezing present.  Musculoskeletal: Normal range of motion.  Lymphadenopathy:     Cervical: No cervical adenopathy.  Skin:    General: Skin is warm and dry.  Neurological:     General: No focal deficit present.     Mental Status: She is alert and oriented to person, place, and time.  Psychiatric:        Mood and Affect: Mood normal.        Behavior: Behavior normal.        Thought Content: Thought content normal.        Judgment: Judgment normal.      UC Treatments / Results  Labs (all labs ordered are listed, but only abnormal results are displayed) Labs Reviewed - No data to display  EKG None  Radiology Dg Chest 2 View  Result Date: 04/23/2018 CLINICAL DATA:  Cough and congestion for 4 days. EXAM: CHEST - 2 VIEW COMPARISON:  Radiographs 12/14/2014. FINDINGS: The heart size and mediastinal contours are stable. There is aortic atherosclerosis. The lungs are clear. There is no pleural effusion or pneumothorax. No acute or suspicious osseous findings. IMPRESSION: No active cardiopulmonary process. Electronically Signed   By: Richardean Sale M.D.   On: 04/23/2018 11:25    Procedures Procedures (including critical care time)  Medications Ordered in UC Medications  ipratropium-albuterol (DUONEB) 0.5-2.5 (3) MG/3ML nebulizer solution 3 mL (3 mLs Nebulization Given 04/23/18 1104)  Since O2 sats improved to 99% on room air following the treatment.  She also felt much better and the wheezing was much decreased  Initial Impression / Assessment and Plan / UC Course  I have reviewed the triage vital signs and the nursing notes.  Pertinent labs & imaging results that were available during my care of the patient were reviewed by me and considered in my  medical decision making (see chart for details).   Since had asthma with exacerbation.  I will start her on doxycycline for 5 days.  Prescribed short course of prednisone as well.  Allergic to codeine and I will give her brompheniramine instead.  So provide her with Ladona Ridgel for daytime use.  Use her Symbicort and albuterol inhalers as prescribed with the albuterol every 4 hours as necessary.  She is not improving she should follow-up with her primary care physician next week   Final Clinical Impressions(s) / UC Diagnoses   Final diagnoses:  Mild intermittent asthma with acute exacerbation     Discharge Instructions     Continue to use your Symbicort and your albuterol inhalers as necessary    ED Prescriptions    Medication Sig Dispense Auth. Provider   doxycycline (VIBRAMYCIN) 100 MG capsule Take 1 capsule (100 mg total) by mouth 2 (two) times daily. 10 capsule Crecencio Mc P, PA-C   benzonatate (TESSALON) 200 MG capsule Take one cap TID PRN cough 30 capsule Crecencio Mc P, PA-C   brompheniramine-pseudoephedrine-DM 30-2-10 MG/5ML syrup Take 5 mLs by mouth 4 (four) times daily as needed. Take for cough 118 mL Crecencio Mc P, PA-C   predniSONE (DELTASONE) 20 MG tablet  Take 2 tablets (40 mg) daily by mouth 8 tablet Lorin Picket, PA-C     Controlled Substance Prescriptions [place] Controlled Substance Registry consulted? Not Applicable   Lorin Picket, PA-C 04/23/18 1146

## 2018-04-23 NOTE — Discharge Instructions (Addendum)
Continue to use your Symbicort and your albuterol inhalers as necessary

## 2018-10-14 DIAGNOSIS — R300 Dysuria: Secondary | ICD-10-CM | POA: Insufficient documentation

## 2019-11-14 DIAGNOSIS — E66813 Obesity, class 3: Secondary | ICD-10-CM | POA: Insufficient documentation

## 2019-12-24 DIAGNOSIS — I5032 Chronic diastolic (congestive) heart failure: Secondary | ICD-10-CM | POA: Insufficient documentation

## 2020-04-26 ENCOUNTER — Other Ambulatory Visit: Payer: Self-pay | Admitting: Family Medicine

## 2020-04-26 DIAGNOSIS — Z1231 Encounter for screening mammogram for malignant neoplasm of breast: Secondary | ICD-10-CM

## 2020-07-23 ENCOUNTER — Other Ambulatory Visit (HOSPITAL_COMMUNITY): Payer: Self-pay | Admitting: Physician Assistant

## 2020-07-23 ENCOUNTER — Other Ambulatory Visit: Payer: Self-pay | Admitting: Physician Assistant

## 2020-07-23 DIAGNOSIS — M12812 Other specific arthropathies, not elsewhere classified, left shoulder: Secondary | ICD-10-CM

## 2020-07-28 ENCOUNTER — Ambulatory Visit
Admission: RE | Admit: 2020-07-28 | Discharge: 2020-07-28 | Disposition: A | Discharge status: Home / Self Care | Payer: Medicare HMO | Source: Ambulatory Visit | Attending: Physician Assistant | Admitting: Physician Assistant

## 2020-07-28 ENCOUNTER — Other Ambulatory Visit: Payer: Self-pay

## 2020-07-28 DIAGNOSIS — M12812 Other specific arthropathies, not elsewhere classified, left shoulder: Secondary | ICD-10-CM | POA: Diagnosis not present

## 2020-09-11 ENCOUNTER — Other Ambulatory Visit: Payer: Self-pay | Admitting: Nephrology

## 2020-09-11 ENCOUNTER — Other Ambulatory Visit (HOSPITAL_COMMUNITY): Payer: Self-pay | Admitting: Nephrology

## 2020-09-11 DIAGNOSIS — I10 Essential (primary) hypertension: Secondary | ICD-10-CM

## 2020-09-11 DIAGNOSIS — N1831 Chronic kidney disease, stage 3a: Secondary | ICD-10-CM | POA: Insufficient documentation

## 2020-09-11 DIAGNOSIS — I509 Heart failure, unspecified: Secondary | ICD-10-CM | POA: Insufficient documentation

## 2020-09-23 ENCOUNTER — Ambulatory Visit
Admission: RE | Admit: 2020-09-23 | Discharge: 2020-09-23 | Disposition: A | Discharge status: Home / Self Care | Payer: Medicare HMO | Source: Ambulatory Visit | Attending: Nephrology | Admitting: Nephrology

## 2020-09-23 ENCOUNTER — Other Ambulatory Visit: Payer: Self-pay

## 2020-09-23 DIAGNOSIS — N1831 Chronic kidney disease, stage 3a: Secondary | ICD-10-CM | POA: Diagnosis not present

## 2020-09-23 DIAGNOSIS — I10 Essential (primary) hypertension: Secondary | ICD-10-CM | POA: Diagnosis present

## 2020-10-07 ENCOUNTER — Other Ambulatory Visit: Payer: Self-pay

## 2020-10-07 ENCOUNTER — Ambulatory Visit (INDEPENDENT_AMBULATORY_CARE_PROVIDER_SITE_OTHER): Payer: Medicare HMO

## 2020-10-07 ENCOUNTER — Ambulatory Visit
Admission: EM | Admit: 2020-10-07 | Discharge: 2020-10-07 | Disposition: A | Discharge status: Home / Self Care | Payer: Medicare HMO

## 2020-10-07 DIAGNOSIS — M25531 Pain in right wrist: Secondary | ICD-10-CM | POA: Diagnosis not present

## 2020-10-07 DIAGNOSIS — M25431 Effusion, right wrist: Secondary | ICD-10-CM

## 2020-10-07 DIAGNOSIS — M19031 Primary osteoarthritis, right wrist: Secondary | ICD-10-CM

## 2020-10-07 MED ORDER — MELOXICAM 15 MG PO TABS: 15.0000 mg | ORAL_TABLET | Freq: Every day | ORAL | 0 refills | Status: AC

## 2020-10-07 NOTE — ED Provider Notes (Signed)
Has [facility]    CSN: [id number] Arrival date & time: 10/07/20  0801      History   Chief Complaint Chief Complaint  Patient presents with   Wrist Pain    HPI Diana Dillon is a 65 y.o. right hand dominant female.  Today, patient has presenting for right wrist pain and swelling x 3 days. Patient says that she was assembling furniture this past Friday and the pain and swelling began after that. She does admit to some tingling in her fingers. She does request an x-ray today.  Patient says the pain is aching and constant has gotten worse since she initially felt the pain.  She has taken Tylenol arthritis without any improvement in her symptoms.  She does admit to underlying arthritis in her hand.  Denies any history of rheumatoid arthritis or gout.  Patient admits to increased pain whenever she is flexing her wrist or thumb.  She also says the overlying skin is warm and little red.  She has no other complaints or concerns.  HPI  Past Medical History:  Diagnosis Date   Hypertension    Thyroid cancer ([place])    in remission   Uterine cancer ([place])    in remission    Patient Active Problem List   Diagnosis Date Noted   H/O thyroidectomy 12/10/2014   History of uterine cancer 12/10/2014   H/O: hysterectomy 12/10/2014   H/O: C-section 12/10/2014   Asthma 12/10/2014   Hypertension 12/10/2014    Past Surgical History:  Procedure Laterality Date   ABDOMINAL HYSTERECTOMY     CESAREAN SECTION     x 2   THYROIDECTOMY Right    partial    OB History     Gravida  3   Para  3   Term      Preterm      AB      Living         SAB      IAB      Ectopic      Multiple      Live Births               Home Medications    Prior to Admission medications   Medication Sig Start Date End Date Taking? Authorizing Provider  meloxicam (MOBIC) 15 MG tablet Take 1 tablet (15 mg total) by mouth daily. 10/07/20 11/06/20 Yes Laurene Footman B, PA-C  albuterol  (PROVENTIL HFA;VENTOLIN HFA) 108 (90 BASE) MCG/ACT inhaler Inhale into the lungs every 6 (six) hours as needed for wheezing or shortness of breath.    [provider]  atenolol (TENORMIN) 25 MG tablet Take by mouth daily.    [provider]  atorvastatin (LIPITOR) 40 MG tablet Take 40 mg by mouth daily. 07/15/20   [provider]  benzonatate (TESSALON) 200 MG capsule Take one cap TID PRN cough 04/23/18   Lorin Picket, PA-C  brompheniramine-pseudoephedrine-DM 30-2-10 MG/5ML syrup Take 5 mLs by mouth 4 (four) times daily as needed. Take for cough 04/23/18   Lorin Picket, PA-C  Cholecalciferol (VITAMIN D PO) Take by mouth.    [provider]  doxycycline (VIBRAMYCIN) 100 MG capsule Take 1 capsule (100 mg total) by mouth 2 (two) times daily. 04/23/18   Lorin Picket, PA-C  gabapentin (NEURONTIN) 300 MG capsule Take 300 mg by mouth daily. 06/22/20   [provider]  JARDIANCE 10 MG TABS tablet Take 10 mg by mouth daily. 10/01/20  [provider]  levothyroxine (SYNTHROID, LEVOTHROID) 75 MCG tablet Take 100 mcg by mouth daily before breakfast.    [provider]  mupirocin ointment (BACTROBAN) 2 % Place 1 application into the nose 2 (two) times daily. 05/24/16   Coral Spikes, DO  nystatin (MYCOSTATIN) 100000 UNIT/ML suspension Take 5 mLs (500,000 Units total) by mouth 4 (four) times daily. 12/10/14   Betancourt, Aura Fey, NP  omeprazole (PRILOSEC) 10 MG capsule Take 10 mg by mouth daily. 09/17/20   [provider]  omeprazole (PRILOSEC) 20 MG capsule Take 20 mg by mouth daily.    [provider]  phentermine (ADIPEX-P) 37.5 MG tablet Take 37.5 mg by mouth every morning. 09/16/20   [provider]  predniSONE (DELTASONE) 20 MG tablet Take 2 tablets (40 mg) daily by mouth 04/23/18   Lorin Picket, PA-C  sulfamethoxazole-trimethoprim (BACTRIM DS,SEPTRA DS) 800-160 MG tablet Take 1 tablet by mouth 2 (two) times  daily. 11/10/15   Norval Gable, MD  SYMBICORT 160-4.5 MCG/ACT inhaler SMARTSIG:2 Puff(s) By Mouth Twice Daily 07/04/20   [provider]  triamcinolone ointment (KENALOG) 0.1 % Apply 1 application topically 2 (two) times daily. 05/24/16   Coral Spikes, DO  triamterene-hydrochlorothiazide (MAXZIDE-25) 37.5-25 MG tablet Take 1 tablet by mouth daily.    [provider]  UNABLE TO FIND Med Name: METHEMAZOLE 10 MG    [provider]  vitamin B-12 (CYANOCOBALAMIN) 1000 MCG tablet Take 1,000 mcg by mouth daily.    [provider]    Family History Family History  Problem Relation Age of Onset   Alzheimer's disease Mother    Bone cancer Father     Social History Social History   Tobacco Use   Smoking status: Every Day    Packs/day: 1.00    Years: 30.00    Pack years: 30.00    Types: Cigarettes    Last attempt to quit: 07/07/2014    Years since quitting: 6.2   Smokeless tobacco: Never  Vaping Use   Vaping Use: Never used  Substance Use Topics   Alcohol use: Yes    Comment: occasionally   Drug use: No     Allergies   Codeine   Review of Systems Review of Systems  Constitutional:  Negative for fatigue and fever.  Musculoskeletal:  Positive for arthralgias and joint swelling.  Skin:  Positive for color change. Negative for rash and wound.  Neurological:  Negative for weakness and numbness.    Physical Exam Triage Vital Signs ED Triage Vitals [10/07/20 0811]  Enc Vitals Group     BP      Pulse      Resp      Temp      Temp src      SpO2      Weight      Height      Head Circumference      Peak Flow      Pain Score 9     Pain Loc      Pain Edu?      Excl. in [place]?    No data found.  Updated Vital Signs BP (!) 141/91   Pulse 85   Temp 98.3 F (36.8 C)   Resp 20   SpO2 100%     Physical Exam Vitals and nursing note reviewed.  Constitutional:      General: She is not in acute distress.    Appearance: Normal appearance.  She is  not ill-appearing or toxic-appearing.  HENT:     Head: Normocephalic and atraumatic.  Eyes:     General: No scleral icterus.       Right eye: No discharge.        Left eye: No discharge.     Conjunctiva/sclera: Conjunctivae normal.  Cardiovascular:     Rate and Rhythm: Normal rate and regular rhythm.     Heart sounds: Normal heart sounds.  Pulmonary:     Effort: Pulmonary effort is normal. No respiratory distress.     Breath sounds: Normal breath sounds.  Musculoskeletal:     Right wrist: Swelling (moderate swelling diffusey of wrist and base of thumb with mild erythem and warmth about the dorsal ulnar wrist) and bony tenderness (TTP distal radius and ulna and base of thumb) present. Decreased range of motion. Normal pulse.     Cervical back: Neck supple.  Skin:    General: Skin is dry.  Neurological:     General: No focal deficit present.     Mental Status: She is alert. Mental status is at baseline.     Motor: No weakness.     Gait: Gait normal.  Psychiatric:        Mood and Affect: Mood normal.        Behavior: Behavior normal.        Thought Content: Thought content normal.     UC Treatments / Results  Labs (all labs ordered are listed, but only abnormal results are displayed) Labs Reviewed - No data to display  EKG   Radiology DG Wrist Complete Right  Result Date: 10/07/2020 CLINICAL DATA:  Swelling and pain EXAM: RIGHT WRIST - COMPLETE 3+ VIEW COMPARISON:  None. FINDINGS: No acute findings. There is diffuse soft tissue swelling. Mild degenerative changes noted at the radiocarpal joint. There also mild degenerative changes at the basilar joint. Chondrocalcinosis noted. No fractures or dislocation. IMPRESSION: 1. No acute findings. 2. Soft tissue swelling. 3. Degenerative changes and chondrocalcinosis. Electronically Signed   By: Kerby Moors M.D.   On: 10/07/2020 08:59    Procedures Procedures (including critical care time)  Medications Ordered in  UC Medications - No data to display  Initial Impression / Assessment and Plan / UC Course  I have reviewed the triage vital signs and the nursing notes.  Pertinent labs & imaging results that were available during my care of the patient were reviewed by me and considered in my medical decision making (see chart for details).   65 y/o female presenting for right wrist pain and swelling x 3 days. No trauma but does request and x-ray.  X-ray obtained and independently reviewed by me.  X-ray shows soft tissue swelling and degenerative changes but no acute bony abnormality.  Reviewed this with patient.  Advised her that she has flareup of the arthritis she has related to the injury.  Encouraged RICE guidelines and use of Mobic as prescribed.  Also she has been given a thumb spica splint.  Encouraged to follow PCP or Ortho if not improving over the next 1 to 2 weeks or if symptoms worsen.   Final Clinical Impressions(s) / UC Diagnoses   Final diagnoses:  Acute pain of right wrist  Swelling of right wrist  Osteoarthritis of right wrist, unspecified osteoarthritis type     Discharge Instructions      WRIST PAIN:  X-ray shows degenerative changes and swelling but no fractures or acute abnormalities of bone. Stressed avoiding painful activities . Wrist brace  given. Reviewed RICE guidelines. Use medications as directed, including NSAIDs. If no NSAIDs have been prescribed for you today, you may take Aleve or Motrin over the counter. May use Tylenol in between doses of NSAIDs.  Consider OTC voltaren gel as well. If no improvement in the next 1-2 weeks, f/u with PCP or [facility].     ED Prescriptions     Medication Sig Dispense Auth. Provider   meloxicam (MOBIC) 15 MG tablet Take 1 tablet (15 mg total) by mouth daily. 30 tablet Gretta Cool      PDMP not reviewed this encounter.   Danton Clap, PA-C 10/07/20 [phone]

## 2020-10-07 NOTE — Discharge Instructions (Addendum)
WRIST PAIN:  X-ray shows degenerative changes and swelling but no fractures or acute abnormalities of bone. Stressed avoiding painful activities . Wrist brace given. Reviewed RICE guidelines. Use medications as directed, including NSAIDs. If no NSAIDs have been prescribed for you today, you may take Aleve or Motrin over the counter. May use Tylenol in between doses of NSAIDs.  Consider OTC voltaren gel as well. If no improvement in the next 1-2 weeks, f/u with PCP or [facility].

## 2020-10-07 NOTE — ED Triage Notes (Signed)
Pt presents with right wrist pain and swelling, pt states she assembled furniture on Friday and then pain began afterward, pt has tingling in fingers, swelling noted

## 2021-05-10 DIAGNOSIS — R202 Paresthesia of skin: Secondary | ICD-10-CM | POA: Insufficient documentation

## 2021-07-09 ENCOUNTER — Ambulatory Visit (INDEPENDENT_AMBULATORY_CARE_PROVIDER_SITE_OTHER): Payer: Medicare HMO

## 2021-07-09 ENCOUNTER — Ambulatory Visit
Admission: EM | Admit: 2021-07-09 | Discharge: 2021-07-09 | Disposition: A | Discharge status: Home / Self Care | Payer: Medicare HMO

## 2021-07-09 DIAGNOSIS — M25472 Effusion, left ankle: Secondary | ICD-10-CM

## 2021-07-09 DIAGNOSIS — M25572 Pain in left ankle and joints of left foot: Secondary | ICD-10-CM | POA: Diagnosis not present

## 2021-07-09 NOTE — Discharge Instructions (Addendum)
SPRAIN: X-rays show that you have some mild arthritis.  No fractures noted.  Concern for stressed avoiding painful activities . Reviewed RICE guidelines. Use medications as directed, including NSAIDs ( Aleve at a very low dose).  Can also use over-the-counter Voltaren anti-inflammatory gel.  Use ankle brace to if needed.  If no improvement in the next 1-2 weeks, f/u with PCP or return to our office for reexamination, and please feel free to call or return at any time for any questions or concerns you may have and we will be happy to help you!     ? ?We discussed the possibility of further work-up for DVT or blood clot.  If your swelling gets worse or the pain worsens you start to have discoloration of your skin, or any worsening symptoms you should be seen again and have an ultrasound. ?

## 2021-07-09 NOTE — ED Triage Notes (Signed)
Patient is here for "left ankle injury/pain". About 2-3 wks fell off a step landed on my knee's. Last night "started aching, like it was twisted, by the time I walked from car to office, swelling/apin continues". No head injury with previous fall. Knee's are "better". No laceration. No abrasions.  ?

## 2021-07-09 NOTE — ED Provider Notes (Signed)
?[facility] ? ? ? ?CSN: [id number] ?Arrival date & time: 07/09/21  1048 ? ? ?  ? ?History   ?Chief Complaint ?Chief Complaint  ?Patient presents with  ? Ankle Pain  ? ? ?HPI ?Diana Dillon is a 66 y.o. female presenting for left ankle and lower leg pain and swelling.  Patient says that she woke up today and had the swelling.  Reports she was moving furniture around all day yesterday.  She admits to increased pain when she tries to bear weight.  She says that about 2 to 3 weeks ago she fell off a step and landed on her knees.  She says she thinks she twisted her ankle at that time.  She reports initially having pain then and swelling behind her knee.  Patient reports it went away pretty quickly.  She says over the last couple days she has noticed the swelling behind her knee went away.  Has applied ice and taken Tylenol for pain only.  Patient reports that her pain is significant especially if she tries to get up and walk.  She denies any calf pain.  No swelling in the opposite leg.  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  History of hypertension, asthma, heart failure.  Not reporting any breathing difficulty or chest pain. No recent travel or surgery. ? ?HPI ? ?Past Medical History:  ?Diagnosis Date  ? Hypertension   ? Thyroid cancer ([place])   ? in remission  ? Uterine cancer ([place])   ? in remission  ? ? ?Patient Active Problem List  ? Diagnosis Date Noted  ? Numbness and tingling in right hand 05/10/2021  ? Chronic kidney disease, stage 3a ([place]) 09/11/2020  ? Heart failure, unspecified ([place]) 09/11/2020  ? Chronic heart failure with preserved ejection fraction ([place]) 12/24/2019  ? Obesity, Class III, BMI 40-49.9 (morbid obesity) ([place]) 11/14/2019  ? Dysuria 10/14/2018  ? Vitamin D deficiency 11/02/2017  ? Prediabetes 01/23/2016  ? H/O thyroidectomy 12/10/2014  ? History of uterine cancer 12/10/2014  ? H/O: hysterectomy 12/10/2014  ? H/O: C-section 12/10/2014  ? Asthma 12/10/2014  ? Moderate persistent asthma without  complication 37/12/6267  ? Thyroid nodule 03/10/2012  ? Hypercholesterolemia 02/29/2012  ? Essential (primary) hypertension 01/27/2012  ? GERD (gastroesophageal reflux disease) 01/27/2012  ? Tobacco use 01/27/2012  ? ? ?Past Surgical History:  ?Procedure Laterality Date  ? ABDOMINAL HYSTERECTOMY    ? CESAREAN SECTION    ? x 2  ? THYROIDECTOMY Right   ? partial  ? ? ?OB History   ? ? Gravida  ?3  ? Para  ?3  ? Term  ?   ? Preterm  ?   ? AB  ?   ? Living  ?   ?  ? ? SAB  ?   ? IAB  ?   ? Ectopic  ?   ? Multiple  ?   ? Live Births  ?   ?   ?  ?  ? ? ? ?Home Medications   ? ?Prior to Admission medications   ?Medication Sig Start Date End Date Taking? Authorizing Provider  ?Cysteamine Bitartrate (PROCYSBI) 300 MG PACK Use 1 each 3 (three) times daily Use as instructed. 11/14/19  Yes [provider]  ?diclofenac Sodium (VOLTAREN) 1 % GEL Apply 1/2 to 1 inch strip to hand and massage thoroughly TID 12/27/18  Yes [provider]  ?furosemide (LASIX) 20 MG tablet Take by mouth. 09/25/19  Yes [provider]  ?  gabapentin (NEURONTIN) 300 MG capsule Take 300 mg by mouth daily. 06/22/20  Yes [provider]  ?glucose blood (PRECISION QID TEST) test strip Use 3 (three) times daily Use as instructed. 11/14/19  Yes [provider]  ?HYDROCHLOROTHIAZIDE PO hydrochlorothiazide Take No date recorded No form recorded No frequency recorded No route recorded No set duration recorded No set duration amount recorded active No dosage strength recorded No dosage strength units of measure recorded   Yes [provider]  ?JARDIANCE 10 MG TABS tablet Take 10 mg by mouth daily. 10/01/20  Yes [provider]  ?levothyroxine (SYNTHROID, LEVOTHROID) 75 MCG tablet Take 100 mcg by mouth daily before breakfast.   Yes [provider]  ?lisdexamfetamine (VYVANSE) 30 MG capsule Take 1 capsule by mouth every morning. 09/14/19  Yes [provider]  ?montelukast (SINGULAIR) 10 MG tablet  Take 10 mg by mouth at bedtime. 05/21/21  Yes [provider]  ?omeprazole (PRILOSEC) 20 MG capsule Take 20 mg by mouth daily.   Yes [provider]  ?acetaminophen (TYLENOL) 500 MG tablet Take by mouth.    [provider]  ?albuterol (PROVENTIL HFA;VENTOLIN HFA) 108 (90 BASE) MCG/ACT inhaler Inhale into the lungs every 6 (six) hours as needed for wheezing or shortness of breath.    [provider]  ?atenolol (TENORMIN) 25 MG tablet Take by mouth daily.    [provider]  ?atorvastatin (LIPITOR) 40 MG tablet Take 40 mg by mouth daily. 07/15/20   [provider]  ?benzonatate (TESSALON) 200 MG capsule Take one cap TID PRN cough 04/23/18   Lorin Picket, PA-C  ?brompheniramine-pseudoephedrine-DM 30-2-10 MG/5ML syrup Take 5 mLs by mouth 4 (four) times daily as needed. Take for cough 04/23/18   Lorin Picket, PA-C  ?Cholecalciferol (VITAMIN D PO) Take by mouth.    [provider]  ?fluticasone (FLONASE) 50 MCG/ACT nasal spray Place into the nose.    [provider]  ?methylPREDNISolone (MEDROL DOSEPAK) 4 MG TBPK tablet See admin instructions. 05/21/21   [provider]  ?mupirocin ointment (BACTROBAN) 2 % Place 1 application into the nose 2 (two) times daily. 05/24/16   Coral Spikes, DO  ?nystatin (MYCOSTATIN) 100000 UNIT/ML suspension Take 5 mLs (500,000 Units total) by mouth 4 (four) times daily. 12/10/14   Betancourt, Aura Fey, NP  ?phentermine (ADIPEX-P) 37.5 MG tablet Take 37.5 mg by mouth every morning. 09/16/20   [provider]  ?predniSONE (DELTASONE) 20 MG tablet Take 2 tablets (40 mg) daily by mouth 04/23/18   Lorin Picket, PA-C  ?SYMBICORT 160-4.5 MCG/ACT inhaler SMARTSIG:2 Puff(s) By Mouth Twice Daily 07/04/20   [provider]  ?telmisartan (MICARDIS) 80 MG tablet Take 80 mg by mouth daily. 05/20/21   [provider]  ?triamcinolone ointment (KENALOG) 0.1 % Apply 1 application topically 2 (two)  times daily. 05/24/16   Coral Spikes, DO  ?UNABLE TO FIND Med Name: METHEMAZOLE 10 MG    [provider]  ?vitamin B-12 (CYANOCOBALAMIN) 1000 MCG tablet Take 1,000 mcg by mouth daily.    [provider]  ? ? ?Family History ?Family History  ?Problem Relation Age of Onset  ? Alzheimer's disease Mother   ? Bone cancer Father   ? ? ?Social History ?Social History  ? ?Tobacco Use  ? Smoking status: Former  ?  Packs/day: 1.00  ?  Years: 30.00  ?  Pack years: 30.00  ?  Types: Cigarettes  ?  Quit date: 07/07/2014  ?  Years since quitting: 7.0  ? Smokeless tobacco: Never  ?Vaping Use  ? Vaping Use: Some days  ?Substance Use Topics  ? Alcohol use: Yes  ?  Comment: occasionally  ? Drug use: No  ? ? ? ?Allergies   ?Codeine and Amlodipine ? ? ?Review of Systems ?Review of Systems  ?Constitutional:  Negative for fever.  ?Respiratory:  Negative for shortness of breath.   ?Cardiovascular:  Positive for leg swelling. Negative for chest pain.  ?Musculoskeletal:  Positive for arthralgias, gait problem and joint swelling.  ?Skin:  Negative for color change, rash and wound.  ?Neurological:  Negative for dizziness and numbness.  ? ? ?Physical Exam ?Triage Vital Signs ?ED Triage Vitals  ?Enc Vitals Group  ?   BP 07/09/21 1102 (!) 180/95  ?   Pulse Rate 07/09/21 1102 85  ?   Resp 07/09/21 1102 20  ?   Temp 07/09/21 1102 98.2 ?F (36.8 ?C)  ?   Temp Source 07/09/21 1102 Oral  ?   SpO2 07/09/21 1102 99 %  ?   Weight 07/09/21 1058 208 lb (94.3 kg)  ?   Height 07/09/21 1058 5' 1.25" (1.556 m)  ?   Head Circumference --   ?   Peak Flow --   ?   Pain Score 07/09/21 1055 9  ?   Pain Loc --   ?   Pain Edu? --   ?   Excl. in [place]? --   ? ?No data found. ? ?Updated Vital Signs ?BP (!) 180/95 (BP Location: Left Arm)   Pulse 85   Temp 98.2 ?F (36.8 ?C) (Oral)   Resp 20   Ht 5' 1.25" (1.556 m)   Wt 208 lb (94.3 kg)   SpO2 99%   BMI 38.98 kg/m?  ?    ? ?Physical Exam ?Vitals and nursing note reviewed.  ?Constitutional:   ?    General: She is not in acute distress. ?   Appearance: Normal appearance. She is not ill-appearing or toxic-appearing.  ?HENT:  ?   Head: Normocephalic and atraumatic.  ?Eyes:  ?   General: No scleral icterus.    ?

## 2021-07-21 ENCOUNTER — Other Ambulatory Visit: Payer: Self-pay | Admitting: Surgery

## 2021-07-30 ENCOUNTER — Encounter
Admission: RE | Admit: 2021-07-30 | Discharge: 2021-07-30 | Disposition: A | Discharge status: Home / Self Care | Payer: Medicare HMO | Source: Ambulatory Visit | Attending: Surgery | Admitting: Surgery

## 2021-07-30 HISTORY — DX: Hypothyroidism, unspecified: E03.9

## 2021-07-30 HISTORY — DX: Heart failure, unspecified: I50.9

## 2021-07-30 HISTORY — DX: Dyspnea, unspecified: R06.00

## 2021-07-30 HISTORY — DX: Gastro-esophageal reflux disease without esophagitis: K21.9

## 2021-07-30 HISTORY — DX: Unspecified asthma, uncomplicated: J45.909

## 2021-07-30 NOTE — Patient Instructions (Addendum)
Your procedure is scheduled on:08/06/2021  Report to the Registration Desk on the 1st floor of the [facility]. To find out your arrival time, please call [phone] between 1PM - 3PM on: 08/05/2021  If your arrival time is 6:00 am, do not arrive prior to that time as the [facility] entrance doors do not open until 6:00 am.  REMEMBER: Instructions that are not followed completely may result in serious medical risk, up to and including death; or upon the discretion of your surgeon and anesthesiologist your surgery may need to be rescheduled.  Do not eat food after midnight the night before surgery.  No gum chewing, lozengers or hard candies.  You may however, drink water up to 2 hours before you are scheduled to arrive for your surgery. Do not drink anything within 2 hours of your scheduled arrival time.  In addition, your doctor has ordered for you to drink the provided  Ensure- clear liquid carb drink Drinking this carbohydrate drink up to two hours before surgery helps to reduce insulin resistance and improve patient outcomes. Please complete drinking 2 hours prior to scheduled arrival time.   Continue taking you prescription medications as prescribed up to the day prior to surgery unless you are instructed to hold or stop and  THESE are the only MEDICATIONS you can take THE MORNING OF SURGERY WITH A SIP OF WATER:   Gabapentin Levothyroxine omeprazole (PRILOSEC)    Hold JARDIANCE 10 MG three days prior to surgery.   Use Symbicort inhaler  at home on the day of surgery and bring albuterol to the hospital.   One week prior to surgery: Stop Anti-inflammatories (NSAIDS) such as Advil, Aleve, Ibuprofen, Motrin, Naproxen, Naprosyn and Aspirin based products such as Excedrin, Goodys Powder, BC Powder. Stop ANY OVER THE COUNTER supplements until after surgery like B-12 You may however, continue to take Tylenol if needed for pain up until the day of surgery.  No Alcohol for 24  hours before or after surgery.  No Smoking including e-cigarettes for 24 hours prior to surgery.  No chewable tobacco products for at least 6 hours prior to surgery.  No nicotine patches on the day of surgery.  Do not use any "recreational" drugs for at least a week prior to your surgery.  Please be advised that the combination of cocaine and anesthesia may have negative outcomes, up to and including death. If you test positive for cocaine, your surgery will be cancelled.  On the morning of surgery brush your teeth with toothpaste and water, you may rinse your mouth with mouthwash if you wish. Do not swallow any toothpaste or mouthwash.  Use CHG Soap  as directed on instruction sheet.  Do not wear jewelry, make-up, hairpins, clips or nail polish.  Do not wear lotions, powders, or perfumes.   Do not shave body from the neck down 48 hours prior to surgery just in case you cut yourself which could leave a site for infection.  Also, freshly shaved skin may become irritated if using the CHG soap.  Contact lenses, hearing aids and dentures may not be worn into surgery.  Do not bring valuables to the hospital. [facility] is not responsible for any missing/lost belongings or valuables.     Notify your doctor if there is any change in your medical condition (cold, fever, infection).  Wear comfortable clothing (specific to your surgery type) to the hospital.  After surgery, you can help prevent lung complications by doing breathing exercises.  Take deep breaths and cough every 1-2 hours. Your doctor may order a device called an Incentive Spirometer to help you take deep breaths.   If you are being admitted to the hospital overnight, leave your suitcase in the car. After surgery it may be brought to your room.  If you are being discharged the day of surgery, you will not be allowed to drive home. You will need a responsible adult (18 years or older) to drive you home and stay with you  that night.   If you are taking public transportation, you will need to have a responsible adult (18 years or older) with you. Please confirm with your physician that it is acceptable to use public transportation.   Please call the [facility] at [phone] if you have any questions about these instructions.  Surgery Visitation Policy:  Patients undergoing a surgery or procedure may have two family members or support persons with them as long as the person is not COVID-19 positive or experiencing its symptoms.   Inpatient Visitation:    Visiting hours are 7 a.m. to 8 p.m. Up to four visitors are allowed at one time in a patient room, including children. The visitors may rotate out with other people during the day. One designated support person (adult) may remain overnight.

## 2021-07-31 ENCOUNTER — Encounter: Payer: Self-pay | Admitting: Urgent Care

## 2021-07-31 ENCOUNTER — Encounter
Admission: RE | Admit: 2021-07-31 | Discharge: 2021-07-31 | Disposition: A | Discharge status: Home / Self Care | Payer: Medicare HMO | Source: Ambulatory Visit | Attending: Surgery | Admitting: Surgery

## 2021-07-31 DIAGNOSIS — Z01818 Encounter for other preprocedural examination: Secondary | ICD-10-CM | POA: Insufficient documentation

## 2021-07-31 DIAGNOSIS — Z6841 Body Mass Index (BMI) 40.0 and over, adult: Secondary | ICD-10-CM | POA: Diagnosis not present

## 2021-07-31 LAB — CBC
HCT: 45.9 % (ref 36.0–46.0)
Hemoglobin: 14.6 g/dL (ref 12.0–15.0)
MCH: 29.6 pg (ref 26.0–34.0)
MCHC: 31.8 g/dL (ref 30.0–36.0)
MCV: 93.1 fL (ref 80.0–100.0)
Platelets: 317 10*3/uL (ref 150–400)
RBC: 4.93 MIL/uL (ref 3.87–5.11)
RDW: 14.3 % (ref 11.5–15.5)
WBC: 7.3 10*3/uL (ref 4.0–10.5)
nRBC: 0 % (ref 0.0–0.2)

## 2021-07-31 LAB — BASIC METABOLIC PANEL
Anion gap: 8 (ref 5–15)
BUN: 13 mg/dL (ref 8–23)
CO2: 27 mmol/L (ref 22–32)
Calcium: 9.3 mg/dL (ref 8.9–10.3)
Chloride: 105 mmol/L (ref 98–111)
Creatinine, Ser: 1.02 mg/dL — ABNORMAL HIGH (ref 0.44–1.00)
GFR, Estimated: >60 mL/min (ref >60)
Glucose, Bld: 92 mg/dL (ref 70–99)
Potassium: 3.6 mmol/L (ref 3.5–5.1)
Sodium: 140 mmol/L (ref 135–145)

## 2021-08-06 ENCOUNTER — Encounter
Admission: RE | Disposition: A | Discharge status: Home / Self Care | Payer: Self-pay | Source: Home / Self Care | Attending: Surgery

## 2021-08-06 ENCOUNTER — Ambulatory Visit
Admission: RE | Admit: 2021-08-06 | Discharge: 2021-08-06 | Disposition: A | Discharge status: Home / Self Care | Payer: Medicare HMO | Attending: Surgery | Admitting: Surgery

## 2021-08-06 ENCOUNTER — Other Ambulatory Visit: Payer: Self-pay

## 2021-08-06 ENCOUNTER — Ambulatory Visit: Payer: Medicare HMO | Admitting: Anesthesiology

## 2021-08-06 ENCOUNTER — Encounter: Payer: Self-pay | Admitting: Surgery

## 2021-08-06 DIAGNOSIS — K219 Gastro-esophageal reflux disease without esophagitis: Secondary | ICD-10-CM | POA: Diagnosis not present

## 2021-08-06 DIAGNOSIS — I110 Hypertensive heart disease with heart failure: Secondary | ICD-10-CM | POA: Insufficient documentation

## 2021-08-06 DIAGNOSIS — F17210 Nicotine dependence, cigarettes, uncomplicated: Secondary | ICD-10-CM | POA: Diagnosis not present

## 2021-08-06 DIAGNOSIS — I509 Heart failure, unspecified: Secondary | ICD-10-CM | POA: Diagnosis not present

## 2021-08-06 DIAGNOSIS — Z8542 Personal history of malignant neoplasm of other parts of uterus: Secondary | ICD-10-CM | POA: Diagnosis not present

## 2021-08-06 DIAGNOSIS — E119 Type 2 diabetes mellitus without complications: Secondary | ICD-10-CM | POA: Insufficient documentation

## 2021-08-06 DIAGNOSIS — G5601 Carpal tunnel syndrome, right upper limb: Secondary | ICD-10-CM | POA: Insufficient documentation

## 2021-08-06 DIAGNOSIS — J45909 Unspecified asthma, uncomplicated: Secondary | ICD-10-CM | POA: Diagnosis not present

## 2021-08-06 DIAGNOSIS — E890 Postprocedural hypothyroidism: Secondary | ICD-10-CM | POA: Diagnosis not present

## 2021-08-06 HISTORY — PX: CARPAL TUNNEL RELEASE: SHX101

## 2021-08-06 LAB — GLUCOSE, CAPILLARY: Glucose-Capillary: 106 mg/dL — ABNORMAL HIGH (ref 70–99)

## 2021-08-06 SURGERY — RELEASE, CARPAL TUNNEL, ENDOSCOPIC
Anesthesia: General | Site: Wrist | Laterality: Right

## 2021-08-06 MED ORDER — ORAL CARE MOUTH RINSE: 15.0000 mL | Freq: Once | OROMUCOSAL | Status: AC

## 2021-08-06 MED ORDER — FENTANYL CITRATE (PF) 100 MCG/2ML IJ SOLN: 25.0000 ug | INTRAMUSCULAR | Status: DC | PRN

## 2021-08-06 MED ORDER — BUPIVACAINE HCL (PF) 0.5 % IJ SOLN
INTRAMUSCULAR | Status: AC
Start: 2021-08-06 — End: ?
  Filled 2021-08-06: qty 30

## 2021-08-06 MED ORDER — FENTANYL CITRATE (PF) 100 MCG/2ML IJ SOLN
INTRAMUSCULAR | Status: DC | PRN
Start: 2021-08-06 — End: 2021-08-06
  Administered 2021-08-06: 50 ug via INTRAVENOUS

## 2021-08-06 MED ORDER — CHLORHEXIDINE GLUCONATE 0.12 % MT SOLN: 15.0000 mL | Freq: Once | OROMUCOSAL | Status: AC

## 2021-08-06 MED ORDER — ONDANSETRON HCL 4 MG/2ML IJ SOLN
INTRAMUSCULAR | Status: DC | PRN
  Administered 2021-08-06: 4 mg via INTRAVENOUS

## 2021-08-06 MED ORDER — CEFAZOLIN SODIUM-DEXTROSE 2-4 GM/100ML-% IV SOLN
INTRAVENOUS | Status: AC
  Filled 2021-08-06: qty 100

## 2021-08-06 MED ORDER — LACTATED RINGERS IV SOLN: INTRAVENOUS | Status: DC

## 2021-08-06 MED ORDER — MIDAZOLAM HCL 2 MG/2ML IJ SOLN
INTRAMUSCULAR | Status: DC | PRN
  Administered 2021-08-06: 2 mg via INTRAVENOUS

## 2021-08-06 MED ORDER — PROPOFOL 1000 MG/100ML IV EMUL
INTRAVENOUS | Status: AC
  Filled 2021-08-06: qty 100

## 2021-08-06 MED ORDER — CHLORHEXIDINE GLUCONATE 0.12 % MT SOLN
OROMUCOSAL | Status: AC
  Administered 2021-08-06: 15 mL via OROMUCOSAL
  Filled 2021-08-06: qty 15

## 2021-08-06 MED ORDER — PROPOFOL 500 MG/50ML IV EMUL
INTRAVENOUS | Status: DC | PRN
  Administered 2021-08-06: 200 ug/kg/min via INTRAVENOUS

## 2021-08-06 MED ORDER — 0.9 % SODIUM CHLORIDE (POUR BTL) OPTIME
TOPICAL | Status: DC | PRN
  Administered 2021-08-06: 500 mL

## 2021-08-06 MED ORDER — CEFAZOLIN SODIUM-DEXTROSE 2-4 GM/100ML-% IV SOLN
2.0000 g | INTRAVENOUS | Status: AC
  Administered 2021-08-06: 2 g via INTRAVENOUS

## 2021-08-06 MED ORDER — OXYCODONE HCL 5 MG PO TABS: 5.0000 mg | ORAL_TABLET | Freq: Once | ORAL | Status: DC | PRN

## 2021-08-06 MED ORDER — BUPIVACAINE HCL (PF) 0.5 % IJ SOLN
INTRAMUSCULAR | Status: DC | PRN
  Administered 2021-08-06: 10 mL

## 2021-08-06 MED ORDER — PROPOFOL 10 MG/ML IV BOLUS
INTRAVENOUS | Status: DC | PRN
  Administered 2021-08-06: 200 mg via INTRAVENOUS

## 2021-08-06 MED ORDER — FENTANYL CITRATE (PF) 100 MCG/2ML IJ SOLN
INTRAMUSCULAR | Status: AC
  Filled 2021-08-06: qty 2

## 2021-08-06 MED ORDER — DEXAMETHASONE SODIUM PHOSPHATE 10 MG/ML IJ SOLN
INTRAMUSCULAR | Status: DC | PRN
  Administered 2021-08-06: 10 mg via INTRAVENOUS

## 2021-08-06 MED ORDER — OXYCODONE HCL 5 MG/5ML PO SOLN: 5.0000 mg | Freq: Once | ORAL | Status: DC | PRN

## 2021-08-06 MED ORDER — MIDAZOLAM HCL 2 MG/2ML IJ SOLN
INTRAMUSCULAR | Status: AC
  Filled 2021-08-06: qty 2

## 2021-08-06 SURGICAL SUPPLY — 31 items
BNDG COHESIVE 4X5 TAN ST LF (GAUZE/BANDAGES/DRESSINGS) ×2 IMPLANT
BNDG ELASTIC 2X5.8 VLCR STR LF (GAUZE/BANDAGES/DRESSINGS) ×2 IMPLANT
BNDG ESMARK 4X12 TAN STRL LF (GAUZE/BANDAGES/DRESSINGS) ×2 IMPLANT
CHLORAPREP W/TINT 26 (MISCELLANEOUS) ×2 IMPLANT
CORD BIP STRL DISP 12FT (MISCELLANEOUS) ×2 IMPLANT
CUFF TOURN SGL QUICK 18X4 (TOURNIQUET CUFF) ×2 IMPLANT
DRAPE SURG 17X11 SM STRL (DRAPES) ×2 IMPLANT
FORCEPS JEWEL BIP 4-3/4 STR (INSTRUMENTS) ×2 IMPLANT
GAUZE SPONGE 4X4 12PLY STRL (GAUZE/BANDAGES/DRESSINGS) ×2 IMPLANT
GAUZE XEROFORM 1X8 LF (GAUZE/BANDAGES/DRESSINGS) ×2 IMPLANT
GLOVE BIO SURGEON STRL SZ8 (GLOVE) ×2 IMPLANT
GLOVE SURG UNDER LTX SZ8 (GLOVE) ×2 IMPLANT
GOWN STRL REUS W/ TWL LRG LVL3 (GOWN DISPOSABLE) ×1 IMPLANT
GOWN STRL REUS W/ TWL XL LVL3 (GOWN DISPOSABLE) ×1 IMPLANT
GOWN STRL REUS W/TWL LRG LVL3 (GOWN DISPOSABLE) ×1
GOWN STRL REUS W/TWL XL LVL3 (GOWN DISPOSABLE) ×1
KIT CARPAL TUNNEL (MISCELLANEOUS) ×1
KIT ESCP INSRT D SLOT CANN KN (MISCELLANEOUS) ×1 IMPLANT
KIT TURNOVER KIT A (KITS) ×2 IMPLANT
MANIFOLD NEPTUNE II (INSTRUMENTS) ×2 IMPLANT
NS IRRIG 500ML POUR BTL (IV SOLUTION) ×2 IMPLANT
PACK EXTREMITY ARMC (MISCELLANEOUS) ×2 IMPLANT
SPLINT WRIST LG LT TX990309 (SOFTGOODS) IMPLANT
SPLINT WRIST LG RT TX900304 (SOFTGOODS) IMPLANT
SPLINT WRIST M LT TX990308 (SOFTGOODS) IMPLANT
SPLINT WRIST M RT TX990303 (SOFTGOODS) IMPLANT
SPLINT WRIST XL LT TX990310 (SOFTGOODS) IMPLANT
SPLINT WRIST XL RT TX990305 (SOFTGOODS) IMPLANT
STOCKINETTE IMPERVIOUS 9X36 MD (GAUZE/BANDAGES/DRESSINGS) ×2 IMPLANT
SUT PROLENE 4 0 PS 2 18 (SUTURE) ×2 IMPLANT
WATER STERILE IRR 500ML POUR (IV SOLUTION) ×2 IMPLANT

## 2021-08-06 NOTE — Discharge Instructions (Addendum)
Orthopedic discharge instructions: Keep dressing dry and intact. Keep hand elevated above heart level. May shower after dressing removed on postop day 4 (Sunday). Cover sutures with Band-Aids after drying off. Apply ice to affected area frequently. Take ibuprofen 600-800 mg TID with meals for 5-7 days, then as necessary. Take ES Tylenol or pain medication as prescribed when needed.  Return for follow-up in 10-14 days or as scheduled.  AMBULATORY SURGERY  DISCHARGE INSTRUCTIONS   The drugs that you were given will stay in your system until tomorrow so for the next 24 hours you should not:  Drive an automobile Make any legal decisions Drink any alcoholic beverage   You may resume regular meals tomorrow.  Today it is better to start with liquids and gradually work up to solid foods.  You may eat anything you prefer, but it is better to start with liquids, then soup and crackers, and gradually work up to solid foods.   Please notify your doctor immediately if you have any unusual bleeding, trouble breathing, redness and pain at the surgery site, drainage, fever, or pain not relieved by medication.    Additional Instructions:        Please contact your physician with any problems or Same Day Surgery at [phone], Monday through Friday 6 am to 4 pm, or [place] at  Main number at [phone].  

## 2021-08-06 NOTE — Transfer of Care (Signed)
Immediate Anesthesia Transfer of Care Note  Patient: Diana Dillon  Procedure(s) Performed: CARPAL TUNNEL RELEASE ENDOSCOPIC (Right: Wrist)  Patient Location: PACU  Anesthesia Type:General  Level of Consciousness: drowsy  Airway & Oxygen Therapy: Patient Spontanous Breathing and Patient connected to nasal cannula oxygen  Post-op Assessment: Report given to RN and Post -op Vital signs reviewed and stable  Post vital signs: Reviewed and stable  Last Vitals:  Vitals Value Taken Time  BP 133/80 08/06/21 1008  Temp    Pulse 79 08/06/21 1015  Resp    SpO2 97 % 08/06/21 1015  Vitals shown include unvalidated device data.  Last Pain:  Vitals:   08/06/21 0856  TempSrc: Temporal  PainSc: 6          Complications: No notable events documented.

## 2021-08-06 NOTE — Anesthesia Postprocedure Evaluation (Signed)
Anesthesia Post Note  Patient: Diana Dillon  Procedure(s) Performed: CARPAL TUNNEL RELEASE ENDOSCOPIC (Right: Wrist)  Patient location during evaluation: PACU Anesthesia Type: General Level of consciousness: awake and alert Pain management: pain level controlled Vital Signs Assessment: post-procedure vital signs reviewed and stable Respiratory status: spontaneous breathing, nonlabored ventilation, respiratory function stable and patient connected to nasal cannula oxygen Cardiovascular status: blood pressure returned to baseline and stable Postop Assessment: no apparent nausea or vomiting Anesthetic complications: no   No notable events documented.   Last Vitals:  Vitals:   08/06/21 1015 08/06/21 1030  BP: (!) 141/86 (!) 150/130  Pulse: 79 72  Resp:    Temp:    SpO2: 97% 96%    Last Pain:  Vitals:   08/06/21 1030  TempSrc:   PainSc: 0-No pain                 Precious Haws Monisha Siebel

## 2021-08-06 NOTE — Anesthesia Procedure Notes (Signed)
Procedure Name: LMA Insertion Date/Time: 08/06/2021 9:40 AM Performed by: Beverely Low, CRNA Pre-anesthesia Checklist: Patient identified, Patient being monitored, Timeout performed, Emergency Drugs available and Suction available Patient Re-evaluated:Patient Re-evaluated prior to induction Oxygen Delivery Method: Circle system utilized Preoxygenation: Pre-oxygenation with 100% oxygen Induction Type: IV induction Ventilation: Mask ventilation without difficulty LMA: LMA inserted LMA Size: 3.5 Tube type: Oral Number of attempts: 1 Placement Confirmation: positive ETCO2 and breath sounds checked- equal and bilateral Tube secured with: Tape Dental Injury: Teeth and Oropharynx as per pre-operative assessment

## 2021-08-06 NOTE — H&P (Signed)
History of Present Illness:  Diana Dillon is a 66 y.o. female is being referred by Vance Peper, PA-C, for a long history of pain and paresthesias to her right hand. She notes the symptoms have been present for 8 or more months but have been worsening over the past 3 months. She describes numbness to the thumb, index, and long fingers, as well as occasionally to the ring finger. She denies any pain in the wrist on today's visit, but will take an occasional ibuprofen when she does experience discomfort. She also has been trying to apply ice to the hand and has been wearing Velcro splints with limited benefit. The patient denies any specific injury to the wrist which may have precipitated the onset of the symptoms, but does work in administration on a keyboard all day and wonders if this may be contributing to her symptoms. She also has pain at night, which occasionally will awaken her from sleep. In addition, she describes increased discomfort/paresthesias with more sustained or repetitive activities such as driving. She saw Vance Peper, PA-C, who sent her for an EMG to evaluate for carpal tunnel syndrome and referred her to me for further evaluation and treatment.  Current Outpatient Medications:  acetaminophen (TYLENOL) 500 MG tablet Take 1,000 mg by mouth nightly Tylenol PM nightly   acetaminophen (TYLENOL) 500 MG tablet Take by mouth   albuterol 90 mcg/actuation inhaler Inhale 2 inhalations into the lungs every 6 (six) hours as needed 18 g 1   blood glucose meter kit by XX route as directed (Patient not taking: Reported on 07/08/2021) 1 each 0   blood sugar diagnostic, drum (BLOOD GLUCOSE DIAGNOSTIC, DRUM) test strip Use 3 (three) times daily Use as instructed. (Patient not taking: Reported on 07/08/2021) 100 each 12   budesonide-formoteroL (SYMBICORT) 160-4.5 mcg/actuation inhaler Inhale 2 inhalations into the lungs 2 (two) times daily 10.2 g 3   empagliflozin (JARDIANCE) 10 mg tablet Take 1 tablet (10 mg  total) by mouth daily with breakfast 90 tablet 3   empagliflozin (JARDIANCE) 10 mg tablet Take 10 mg by mouth once daily   fluticasone propionate (FLONASE) 50 mcg/actuation nasal spray Place 2 sprays into both nostrils as needed   FUROsemide (LASIX) 20 MG tablet Take 1 tablet (20 mg total) by mouth once daily as needed for Edema 90 tablet 3   gabapentin (NEURONTIN) 300 MG capsule Take 1 capsule (300 mg total) by mouth once daily 90 capsule 2   lancets Use 1 each 3 (three) times daily Use as instructed. (Patient not taking: Reported on 07/08/2021) 100 each 12   levothyroxine (SYNTHROID) 75 MCG tablet Take 1 tablet by mouth 6 days/week, none on Sunday. Take on an empty stomach with a glass of water at least 30-60 minutes before breakfast. 30 tablet 11   levothyroxine (SYNTHROID) 75 MCG tablet Take by mouth   montelukast (SINGULAIR) 10 mg tablet Take 1 tablet (10 mg total) by mouth at bedtime 90 tablet 3   omeprazole (PRILOSEC) 10 MG DR capsule Take 1 capsule (10 mg total) by mouth once daily 90 capsule 1   phentermine (ADIPEX-P) 37.5 mg tablet Take 1 tablet (37.5 mg total) by mouth every morning before breakfast 30 tablet 5   phentermine (ADIPEX-P) 37.5 mg tablet Take 37.5 mg by mouth every morning   telmisartan (MICARDIS) 80 MG tablet Take 1 tablet (80 mg total) by mouth once daily 90 tablet 3   telmisartan (MICARDIS) 80 MG tablet Take by mouth   Allergies:  Amlodipine Swelling   Codeine Hives   Past Medical History:   Asthma without status asthmaticus   GERD (gastroesophageal reflux disease)   Goiter 03/10/2012   Hypertension   Hyperthyroidism, s/p thyroid cancer 03/29/2011   Hyperthyroidism, s/p thyroid cancer 01/27/2012   Hypokalemia 08/04/2017   Hypothyroidism, postop   Obesity   Shingles   Thyroid cancer, s/p thyroidectomy 1987 01/27/2012   Thyroid nodule 03/10/2012   Past Surgical History:   THYROID SURGERY 1987 (Right side Thyroidectomy)   THYROIDECTOMY FOR REMOVAL REMAINING TISSUE  Left 11/22/2015 (Procedure: THYROIDECTOMY FOR REMOVAL REMAINING TISSUE; Surgeon: Lorie Phenix, MD; Location: [facility]; Service: General Surgery; Laterality: Left; LIGASURE IS NEEDED)   MONITORING CRANIAL NERVES UNILATERAL Left 11/22/2015  Procedure: NEEDLE ELECTROMYOGRAPHY; CRANIAL NERVE SUPPLIED MUSCLE(S), UNILATERAL; Surgeon: Lorie Phenix, MD; Location: [facility]; Service: General Surgery; Laterality: Left;   HYSTERECTOMY (with partial oophrectomy and bilateral tubal ligation)   Family History:   Alzheimer's disease Mother   Stroke Mother   Bone cancer Father   Coronary Artery Disease (Blocked arteries around heart) Sister   Thyroid disease Sister   Hyperthyroidism Sister   Hypothyroidism Sister   Thyroid cancer Sister   Thyroid disease Sister   Hypothyroidism Sister   Anesthesia problems Neg Hx   Social History:   Socioeconomic History:   Marital status: Widowed   Number of children: 3   Years of education: 14   Highest education level: Some college, no degree  Occupational History   Occupation: Radiation protection practitioner  Tobacco Use   Smoking status: Every Day  Packs/day: 0.25  Years: 25.00  Pack years: 6.25  Types: Cigarettes  Last attempt to quit: 06/09/2014  Years since quitting: 7.1   Smokeless tobacco: Never  Vaping Use   Vaping Use: Never used  Substance and Sexual Activity   Alcohol use: Not Currently   Drug use: No   Sexual activity: Not Currently  Birth control/protection: None  Social History Narrative  Married. Works as Glass blower/designer at [employer]. Husband disabled from severe arthritis. Hobbies- gardening, cooking, crafts. Kids grown. From [place], [place]. Located to [place] in 2006.   07/07/2021  Military Service: None  Likes/Enjoys/What fills your day?: Narrative  Home: Third floor  Your Bedrooms is on: third floor with elevator  Fewest Steps to enter the home: 0  Other persons in the home: lives alone  Pets: none  Medical equipment  you use daily: None  Medical equipment available in the home: None  Dental: significant dental problems. Last screening Date: April 2020 Screening is: In need  Vision: Denies any issues with Vision","Wears Reading glasses","Prescription Glasses","Contact Lenses"}.. Last screening Date: April 2023 Screening is: In need  Hearing: Denies any issues in Hearing .  Dermatology: Denies any areas of concern on skin. Last screening Date: November 2020 Screening is: Up to date   Social Determinants of Health:   Merchant navy officer: Low Risk   Difficulty of Paying Living Expenses: Not hard at all  Food Insecurity: No Food Insecurity   Worried About Charity fundraiser in the Last Year: Never true   Arboriculturist in the Last Year: Never true  Transportation Needs: No Transportation Needs   Lack of Transportation (Medical): No   Lack of Transportation (Non-Medical): No   Review of Systems:  A comprehensive 14 point ROS was performed, reviewed, and the pertinent orthopaedic findings are documented in the HPI.  Physical Exam: Vitals:  07/16/21 0921  BP: (!) 132/96  Weight:  95.4 kg (210 lb 6.4 oz)  Height: 154.9 cm ('5\' 1"' )  PainSc: 0-No pain  PainLoc: Wrist   General/Constitutional: Pleasant overweight middle-aged female in no acute distress. Neuro/Psych: Normal mood and affect, oriented to person, place and time. Eyes: Non-icteric. Pupils are equal, round, and reactive to light, and exhibit synchronous movement. ENT: Unremarkable. Lymphatic: No palpable adenopathy. Respiratory: Lungs clear to auscultation, Normal chest excursion, No wheezes and Non-labored breathing Cardiovascular: Regular rate and rhythm. No murmurs. and No edema, swelling or tenderness, except as noted in detailed exam. Integumentary: No impressive skin lesions present, except as noted in detailed exam. Musculoskeletal: Unremarkable, except as noted in detailed exam.  Right hand exam: Skin inspection of the right  hand is unremarkable. No swelling, erythema, ecchymosis, abrasions, or other skin abnormalities are identified. There is no tenderness to palpation over the volar or dorsal aspects of the wrist, nor does she have any tenderness palpation over the dorsal or palmar aspects of the hand or fingers. She exhibits full active and passive range of motion of the wrist without any pain or catching. She is able to actively flex and extend all digits fully without any pain or triggering. She is neurovascularly intact to all digits, other than some subjectively decreased sensation to light touch to the tips of the thumb, index, and long fingers. She has a positive Phalen's test as well as a positive Tinel's over the carpal tunnel.  X-rays/MRI/Lab data:  The results of the recent EMG of the right hand and upper extremity are available for review and have been reviewed by myself. By report, the study demonstrates evidence of a "chronic, severe right carpal tunnel syndrome." This report has been reviewed by myself and discussed with the patient.  Assessment:  Carpal tunnel syndrome, right.   Plan: The treatment options were discussed with the patient. In addition, patient educational materials were provided regarding the diagnosis and treatment options. The patient is quite frustrated by her symptoms and functional limitations, and is ready to consider more aggressive treatment options. Therefore, I have recommended a surgical procedure, specifically an endoscopic right carpal tunnel release. The procedure was discussed with the patient, as were the potential risks (including bleeding, infection, nerve and/or blood vessel injury, persistent or recurrent pain/paresthesias, weakness of grip, need for further surgery, blood clots, strokes, heart attacks and/or arhythmias, pneumonia, etc.) and benefits. The patient states her understanding and wishes to proceed. All of the patient's questions and concerns were answered. She  can call any time with further concerns. She will follow up post-surgery, routine.    H&P reviewed and patient re-examined. No changes.

## 2021-08-06 NOTE — Anesthesia Preprocedure Evaluation (Signed)
Anesthesia Evaluation  Patient identified by MRN, date of birth, ID band Patient awake    Reviewed: Allergy & Precautions, NPO status , Patient's Chart, lab work & pertinent test results  History of Anesthesia Complications Negative for: history of anesthetic complications  Airway Mallampati: III  TM Distance: <3 FB Neck ROM: full    Dental  (+) Chipped   Pulmonary neg shortness of breath, asthma , Current Smoker and Patient abstained from smoking.,    Pulmonary exam normal        Cardiovascular Exercise Tolerance: Good hypertension, +CHF  Normal cardiovascular exam     Neuro/Psych negative neurological ROS  negative psych ROS   GI/Hepatic Neg liver ROS, GERD  Controlled,  Endo/Other  Hypothyroidism   Renal/GU Renal disease     Musculoskeletal   Abdominal   Peds  Hematology negative hematology ROS (+)   Anesthesia Other Findings Past Medical History: No date: Asthma No date: CHF (congestive heart failure) (HCC) No date: Dyspnea No date: GERD (gastroesophageal reflux disease) No date: Hypertension No date: Hypothyroidism No date: Thyroid cancer ([place])     Comment:  in remission No date: Uterine cancer ([place])     Comment:  in remission  Past Surgical History: No date: ABDOMINAL HYSTERECTOMY No date: CESAREAN SECTION     Comment:  x 2 No date: THYROIDECTOMY; Right     Comment:  partial  BMI    Body Mass Index: 39.36 kg/m      Reproductive/Obstetrics negative OB ROS                             Anesthesia Physical Anesthesia Plan  ASA: 3  Anesthesia Plan: General LMA   Post-op Pain Management:    Induction: Intravenous  PONV Risk Score and Plan: Dexamethasone, Ondansetron, Midazolam, Treatment may vary due to age or medical condition, TIVA and Propofol infusion  Airway Management Planned: LMA  Additional Equipment:   Intra-op Plan:   Post-operative Plan:  Extubation in OR  Informed Consent: I have reviewed the patients History and Physical, chart, labs and discussed the procedure including the risks, benefits and alternatives for the proposed anesthesia with the patient or authorized representative who has indicated his/her understanding and acceptance.     Dental Advisory Given  Plan Discussed with: Anesthesiologist, CRNA and Surgeon  Anesthesia Plan Comments: (Patient consented for risks of anesthesia including but not limited to:  - adverse reactions to medications - damage to eyes, teeth, lips or other oral mucosa - nerve damage due to positioning  - sore throat or hoarseness - Damage to heart, brain, nerves, lungs, other parts of body or loss of life  Patient voiced understanding.)        Anesthesia Quick Evaluation

## 2021-08-06 NOTE — Op Note (Signed)
08/06/2021  10:23 AM  Patient:   Diana Dillon  Pre-Op Diagnosis:   Right carpal tunnel syndrome.  Post-Op Diagnosis:   Same.  Procedure:   Endoscopic right carpal tunnel release.  Surgeon:   Pascal Lux, MD  Anesthesia:   General LMA  Findings:   As above.  Complications:   None  EBL:   0 cc  Fluids:   250 cc crystalloid  TT:   12 minutes at 250 mmHg  Drains:   None  Closure:   4-0 Prolene interrupted sutures  Brief Clinical Note:   The patient is a 66 year old female with a long history of gradually worsening pain and paresthesias to her right hand. Her symptoms have progressed despite medications, activity modification, splinting, etc.  er history and examination are consistent with carpal tunnel syndrome, confirmed by EMG. The patient presents at this time for an endoscopic right carpal tunnel release.   Procedure:   The patient was brought into the operating room and lain in the supine position. After adequate general laryngeal mask anesthesia was obtained, the right hand and upper extremity were prepped with ChloraPrep solution before being draped sterilely. Preoperative antibiotics were administered. A timeout was performed to verify the appropriate surgical site before the limb was exsanguinated with an Esmarch and the tourniquet inflated to 250 mmHg.   An approximately 1.5-2 cm incision was made over the volar wrist flexion crease, centered over the palmaris longus tendon. The incision was carried down through the subcutaneous tissues with care taken to identify and protect any neurovascular structures. The distal forearm fascia was penetrated just proximal to the transverse carpal ligament. The soft tissues were released off the superficial and deep surfaces of the distal forearm fascia and this was released proximally for 3-4 cm under direct visualization.  Attention was directed distally. The Soil scientist was passed beneath the transverse carpal ligament  along the ulnar aspect of the carpal tunnel and used to release any adhesions as well as to remove any adherent synovial tissue before first the smaller then the larger of the two dilators were passed beneath the transverse carpal ligament along the ulnar margin of the carpal tunnel. The slotted cannula was introduced and the endoscope was placed into the slotted cannula and the undersurface of the transverse carpal ligament visualized. The distal margin of the transverse carpal ligament was marked by placing a 25-gauge needle percutaneously at [place] cardinal point so that it entered the distal portion of the slotted cannula. Under endoscopic visualization, the transverse carpal ligament was released from proximal to distal using the end-cutting blade. A second pass was performed to ensure complete release of the ligament. The adequacy of release was verified both endoscopically and by palpation using the freer elevator.  The wound was irrigated thoroughly with sterile saline solution before being closed using 4-0 Prolene interrupted sutures. A total of 10 cc of 0.5% plain Sensorcaine was injected in and around the incision before a sterile bulky dressing was applied to the wound. The patient was placed into a volar wrist splint before being awakened, extubated, and returned to the recovery room in satisfactory condition after tolerating the procedure well.

## 2021-09-13 ENCOUNTER — Ambulatory Visit
Admission: EM | Admit: 2021-09-13 | Discharge: 2021-09-13 | Disposition: A | Discharge status: Home / Self Care | Payer: Medicare HMO | Attending: Emergency Medicine | Admitting: Emergency Medicine

## 2021-09-13 DIAGNOSIS — R197 Diarrhea, unspecified: Secondary | ICD-10-CM | POA: Diagnosis not present

## 2021-09-13 MED ORDER — ONDANSETRON 4 MG PO TBDP: 4.0000 mg | ORAL_TABLET | Freq: Three times a day (TID) | ORAL | 0 refills | Status: DC | PRN

## 2021-09-13 MED ORDER — DICYCLOMINE HCL 20 MG PO TABS: 20.0000 mg | ORAL_TABLET | Freq: Every day | ORAL | 0 refills | Status: DC

## 2021-09-13 MED ORDER — VANCOMYCIN HCL 125 MG PO CAPS: 125.0000 mg | ORAL_CAPSULE | Freq: Four times a day (QID) | ORAL | 0 refills | Status: AC

## 2021-09-13 NOTE — Discharge Instructions (Addendum)
Unfortunately as your stool is formed we are unable to check for C. difficile however based on your symptoms I will begin treatment  take vancomycin every 6 hours for the next 10 days  Take Bentyl every morning for 10 days, this medicine is to help reduce stomach cramping which in turn will help with pain, you may take Tylenol or ibuprofen every 6 hours for additional comfort as needed  You may use Zofran every 8 hours as needed to help manage nausea, wait 30 minutes to an hour after use to eat or drink  Please stop use of antidiarrhea medication as we do not want the bacteria to stay within your body  Ensure that you are increasing your fluid intake through use of water and electrolyte replacement products such as Gatorade to prevent dehydration  Schedule follow-up appointment with your primary doctor for 10 days after completion of antibiotic for reevaluation of symptoms  At any point if you feel your symptoms are not improving or are worsening even with use of antibiotics please go to the nearest emergency department for evaluation for IV medications

## 2021-09-13 NOTE — ED Triage Notes (Signed)
Pt c/o Diarrhea, abdominal pain, x2weeks  Pt took 2 OTC anti-diarrheal.  Pt was on an prednisone after getting bronchitis recently.

## 2021-09-13 NOTE — ED Provider Notes (Signed)
[facility]    CSN: [id number] Arrival date & time: 09/13/21  1420      History   Chief Complaint Chief Complaint  Patient presents with   Abdominal Pain   Diarrhea    HPI Diana Dillon is a 66 y.o. female.   Patient presents with mid lower abdominal pain and persistent diarrhea for 2 weeks.  Diarrhea is described as soft to watery, yellow to a khaki color with mucus and a sweet foul smell.  Abdominal pain has been constant described as a cramping and general discomfort.  Has attempted use of an over-the-counter antidiarrheal medication which did slow the diarrhea but did not resolve it.  z use Associated nausea without vomiting has been able to tolerate some food and liquids.  No known sick contacts.  Denies recent antibiotic use or recent hospitalization.  Did have carpal tunnel surgery on 6/13, denies post op antibiotics.  Denies fever, chills, body aches, dizziness, lightheadedness, URI symptoms.  Past Medical History:  Diagnosis Date   Asthma    CHF (congestive heart failure) (HCC)    Dyspnea    GERD (gastroesophageal reflux disease)    Hypertension    Hypothyroidism    Thyroid cancer ([place])    in remission   Uterine cancer ([place])    in remission    Patient Active Problem List   Diagnosis Date Noted   Numbness and tingling in right hand 05/10/2021   Chronic kidney disease, stage 3a ([place]) 09/11/2020   Heart failure, unspecified ([place]) 09/11/2020   Chronic heart failure with preserved ejection fraction ([place]) 12/24/2019   Obesity, Class III, BMI 40-49.9 (morbid obesity) ([place]) 11/14/2019   Dysuria 10/14/2018   Vitamin D deficiency 11/02/2017   Prediabetes 01/23/2016   H/O thyroidectomy 12/10/2014   History of uterine cancer 12/10/2014   H/O: hysterectomy 12/10/2014   H/O: C-section 12/10/2014   Asthma 12/10/2014   Moderate persistent asthma without complication 73/22/0254   Thyroid nodule 03/10/2012   Hypercholesterolemia 02/29/2012   Essential  (primary) hypertension 01/27/2012   GERD (gastroesophageal reflux disease) 01/27/2012   Tobacco use 01/27/2012    Past Surgical History:  Procedure Laterality Date   ABDOMINAL HYSTERECTOMY     CARPAL TUNNEL RELEASE Right 08/06/2021   Procedure: CARPAL TUNNEL RELEASE ENDOSCOPIC;  Surgeon: Corky Mull, MD;  Location: [facility] ORS;  Service: Orthopedics;  Laterality: Right;   CESAREAN SECTION     x 2   THYROIDECTOMY Right    partial    OB History     Gravida  3   Para  3   Term      Preterm      AB      Living         SAB      IAB      Ectopic      Multiple      Live Births               Home Medications    Prior to Admission medications   Medication Sig Start Date End Date Taking? Authorizing Provider  acetaminophen (TYLENOL) 500 MG tablet Take by mouth.   Yes [provider]  albuterol (PROVENTIL HFA;VENTOLIN HFA) 108 (90 BASE) MCG/ACT inhaler Inhale into the lungs every 6 (six) hours as needed for wheezing or shortness of breath.   Yes [provider]  diclofenac Sodium (VOLTAREN) 1 % GEL Apply 1/2 to 1 inch strip to hand and massage thoroughly TID 12/27/18  Yes  [provider]  diphenhydramine-acetaminophen (TYLENOL PM EXTRA STRENGTH) 25-500 MG TABS tablet Take 2 tablets by mouth at bedtime as needed.   Yes [provider]  fluticasone (FLONASE) 50 MCG/ACT nasal spray Place into the nose.   Yes [provider]  furosemide (LASIX) 20 MG tablet Take by mouth as needed. 09/25/19  Yes [provider]  gabapentin (NEURONTIN) 300 MG capsule Take 300 mg by mouth daily. 06/22/20  Yes [provider]  glucose blood (PRECISION QID TEST) test strip Use 3 (three) times daily Use as instructed. 11/14/19  Yes [provider]  JARDIANCE 10 MG TABS tablet Take 10 mg by mouth daily. 10/01/20  Yes [provider]  levothyroxine (SYNTHROID, LEVOTHROID) 75 MCG tablet Take 75 mcg by mouth daily before  breakfast.   Yes [provider]  montelukast (SINGULAIR) 10 MG tablet Take 10 mg by mouth at bedtime. 05/21/21  Yes [provider]  omeprazole (PRILOSEC) 20 MG capsule Take 10 mg by mouth daily.   Yes [provider]  phentermine (ADIPEX-P) 37.5 MG tablet Take 37.5 mg by mouth every morning. 09/16/20  Yes [provider]  SYMBICORT 160-4.5 MCG/ACT inhaler SMARTSIG:2 Puff(s) By Mouth Twice Daily 07/04/20  Yes [provider]  telmisartan (MICARDIS) 80 MG tablet Take 80 mg by mouth daily. 05/20/21  Yes [provider]    Family History Family History  Problem Relation Age of Onset   Alzheimer's disease Mother    Bone cancer Father     Social History Social History   Tobacco Use   Smoking status: Every Day    Packs/day: 1.00    Years: 30.00    Total pack years: 30.00    Types: Cigarettes    Last attempt to quit: 07/07/2014    Years since quitting: 7.1   Smokeless tobacco: Never  Vaping Use   Vaping Use: Some days  Substance Use Topics   Alcohol use: Yes    Comment: occasionally   Drug use: No     Allergies   Codeine and Amlodipine   Review of Systems Review of Systems  Constitutional: Negative.   Respiratory: Negative.    Cardiovascular: Negative.   Gastrointestinal:  Positive for abdominal pain, diarrhea and nausea. Negative for abdominal distention, anal bleeding, blood in stool, constipation, rectal pain and vomiting.  Skin: Negative.   Neurological: Negative.      Physical Exam Triage Vital Signs ED Triage Vitals  Enc Vitals Group     BP 09/13/21 1438 (!) 165/95     Pulse Rate 09/13/21 1438 88     Resp 09/13/21 1438 18     Temp 09/13/21 1438 98.8 F (37.1 C)     Temp Source 09/13/21 1438 Oral     SpO2 09/13/21 1438 97 %     Weight 09/13/21 1437 205 lb (93 kg)     Height 09/13/21 1437 '5\' 1"'$  (1.549 m)     Head Circumference --      Peak Flow --      Pain Score 09/13/21 1436 7     Pain Loc --       Pain Edu? --      Excl. in [place]? --    No data found.  Updated Vital Signs BP (!) 165/95 (BP Location: Left Arm)   Pulse 88   Temp 98.8 F (37.1 C) (Oral)   Resp 18   Ht '5\' 1"'$  (1.549 m)   Wt 205 lb (93 kg)  SpO2 97%   BMI 38.73 kg/m   Visual Acuity Right Eye Distance:   Left Eye Distance:   Bilateral Distance:    Right Eye Near:   Left Eye Near:    Bilateral Near:     Physical Exam Constitutional:      Appearance: Normal appearance. She is well-developed.  HENT:     Head: Normocephalic.  Eyes:     Extraocular Movements: Extraocular movements intact.  Pulmonary:     Effort: Pulmonary effort is normal.  Abdominal:     General: Abdomen is flat. Bowel sounds are normal.     Palpations: Abdomen is soft.     Tenderness: There is abdominal tenderness in the right lower quadrant and left lower quadrant. There is no guarding.  Skin:    General: Skin is warm and dry.  Neurological:     Mental Status: She is alert and oriented to person, place, and time. Mental status is at baseline.  Psychiatric:        Behavior: Behavior normal.      UC Treatments / Results  Labs (all labs ordered are listed, but only abnormal results are displayed) Labs Reviewed - No data to display  EKG   Radiology No results found.  Procedures Procedures (including critical care time)  Medications Ordered in UC Medications - No data to display  Initial Impression / Assessment and Plan / UC Course  I have reviewed the triage vital signs and the nursing notes.  Pertinent labs & imaging results that were available during my care of the patient were reviewed by me and considered in my medical decision making (see chart for details).  Diarrhea  Vital signs are stable and patient is in no signs of distress, unable to obtain complete C. difficile testing due to formed stool related to antidiarrheal use, will prophylactically begin treatment based on description of the stool which is  consistent with C. difficile, vancomycin 10-day course prescribed as well as Zofran and bentyl additional supportive care, advised follow-up appointment with PCP at completion of antibiotics for reevaluation of symptoms, given strict precautions that if symptoms continue to persist or worsen she will need to go to the nearest emergency department for further evaluation for IV antibiotics Final Clinical Impressions(s) / UC Diagnoses   Final diagnoses:  None   Discharge Instructions   None    ED Prescriptions   None    PDMP not reviewed this encounter.   Hans Eden, NP 09/13/21 1510

## 2021-09-21 ENCOUNTER — Telehealth (HOSPITAL_COMMUNITY): Payer: Self-pay | Admitting: Family Medicine

## 2021-09-21 NOTE — Telephone Encounter (Signed)
We received prior authorization request for vancomycin which was prescribed on July 8 for possible C. difficile.  We did not have a positive test.  On review of her chart in care everywhere she has been seen by her primary and by an emergency room.  They did not determine that she needed treatment, and she is to do testing.  I therefore refused the prior authorization and will be faxed back

## 2021-10-16 IMAGING — MR MR SHOULDER*L* W/O CM
4 of 5 series · 31 of 40 positions shown · non-contrast
Comparison: None.

CLINICAL DATA: Chronic left shoulder pain and limited range of
motion. No recent injury. Remote history of shoulder fracture.

EXAM:
MRI OF THE LEFT SHOULDER WITHOUT CONTRAST
TECHNIQUE: Multiplanar, multisequence MR imaging of the shoulder was performed.
No intravenous contrast was administered.

[Series 3: T2 fat-sat · axial · left · 4.0mm · 0.44mm/px · z∈[-95,+25]mm · 8 of 26 slices shown (1 of 3)]
[im 1/26]
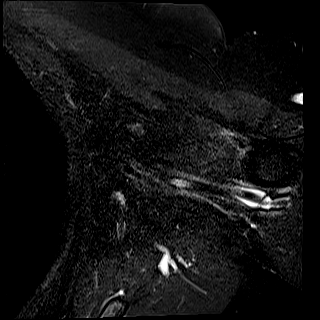
[im 4/26]
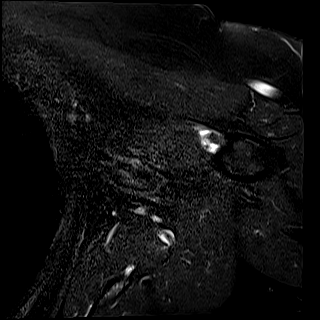
[im 8/26]
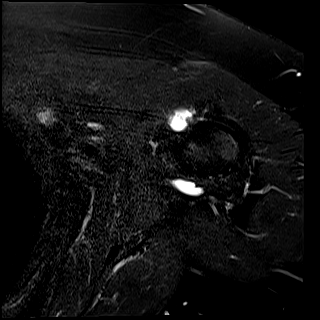
[im 11/26]
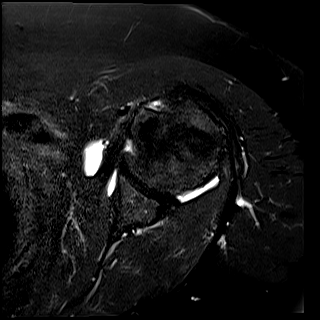
[im 15/26]
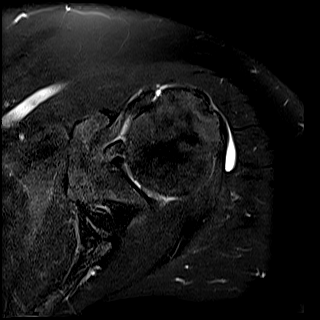
[im 18/26]
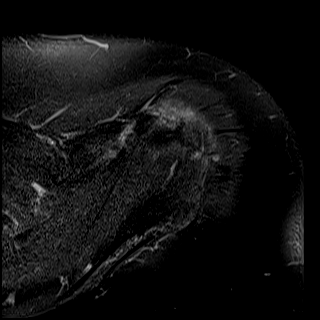
[im 22/26]
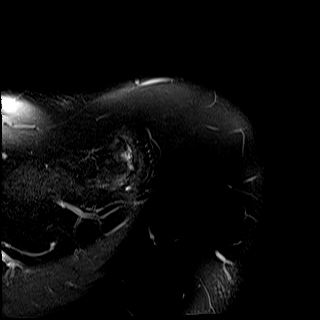
[im 26/26]
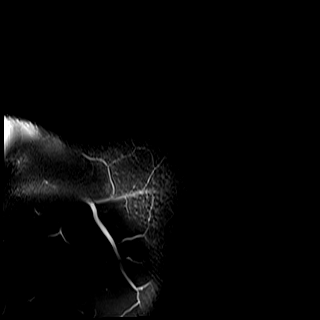

[Series 4: PD · oblique · left · 4.0mm · 0.44mm/px · 9 of 26 slices shown]
[im 1/26]
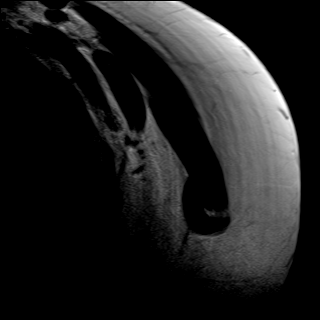
[im 4/26]
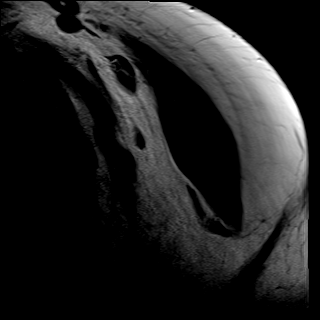
[im 7/26]
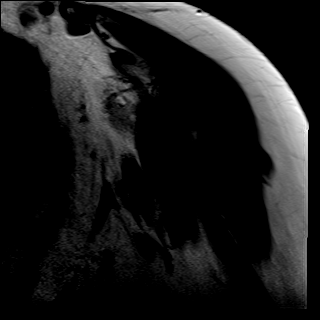
[im 10/26]
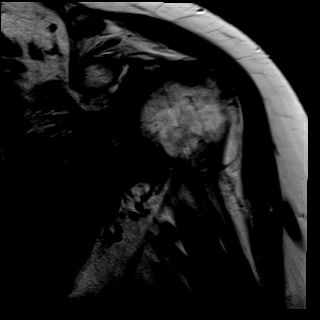
[im 13/26]
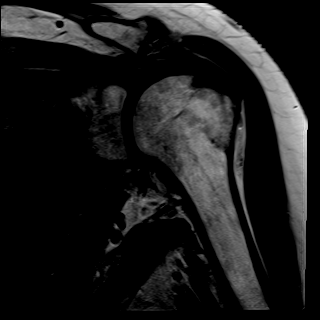
[im 16/26]
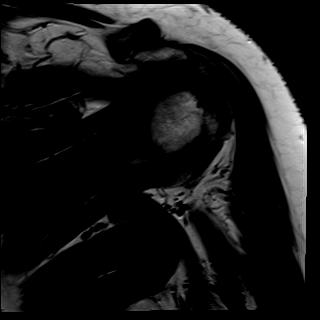
[im 19/26]
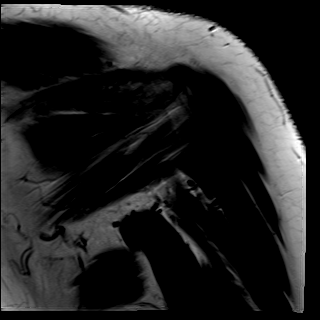
[im 22/26]
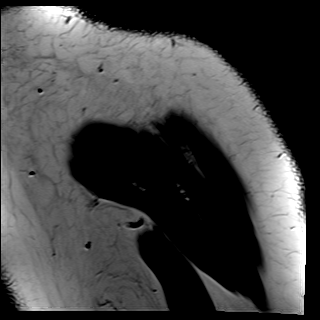
[im 26/26]
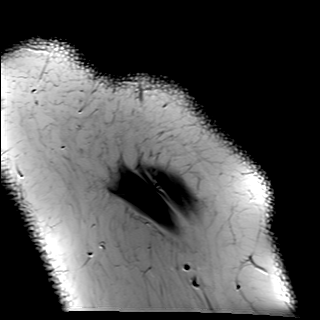

[Series 5: T2 fat-sat · oblique · left · 4.0mm · 0.44mm/px · 9 of 26 slices shown (2 of 3)]
[im 1/26]
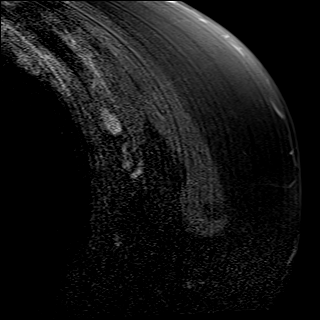
[im 4/26]
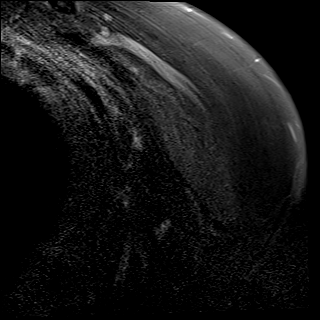
[im 7/26]
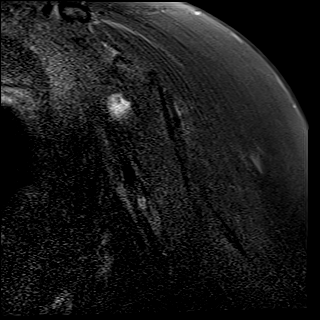
[im 10/26]
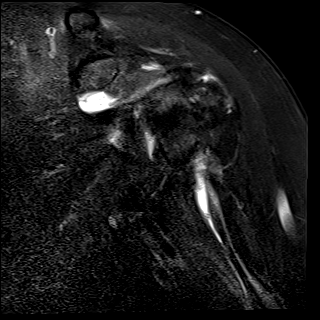
[im 13/26]
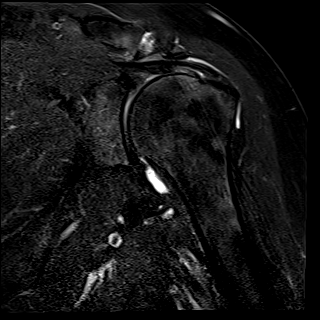
[im 16/26]
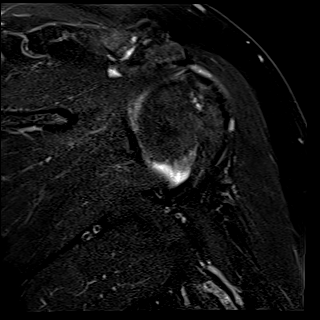
[im 19/26]
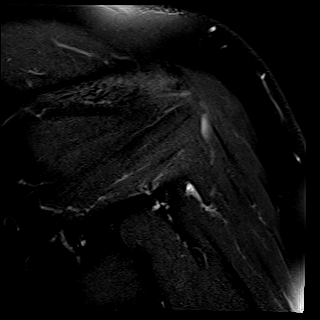
[im 22/26]
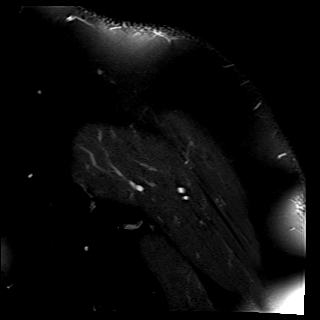
[im 26/26]
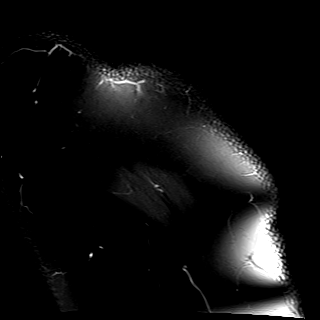

[Series 6: T2 fat-sat · oblique · left · 4.0mm · 0.22mm/px · 5 of 22 slices shown (3 of 3)]
[im 1/22]
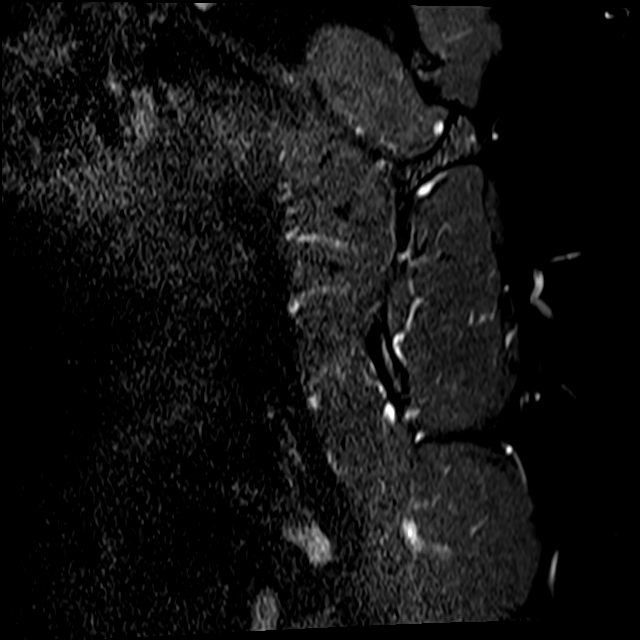
[im 4/22]
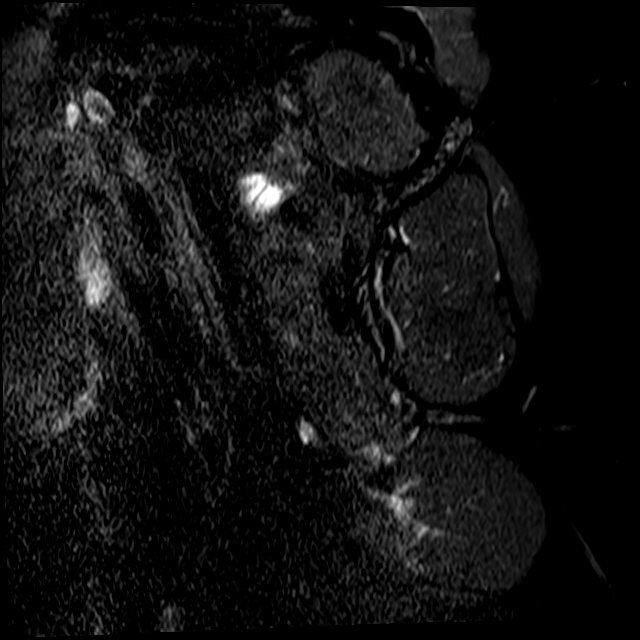
[im 8/22]
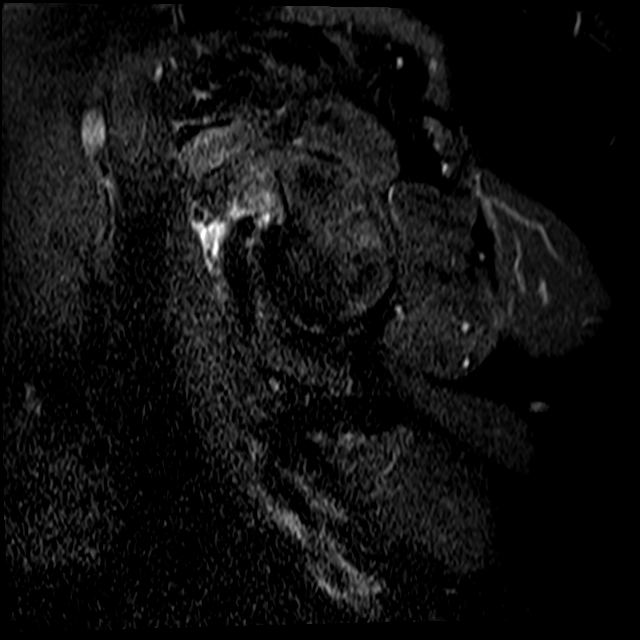
[im 11/22]
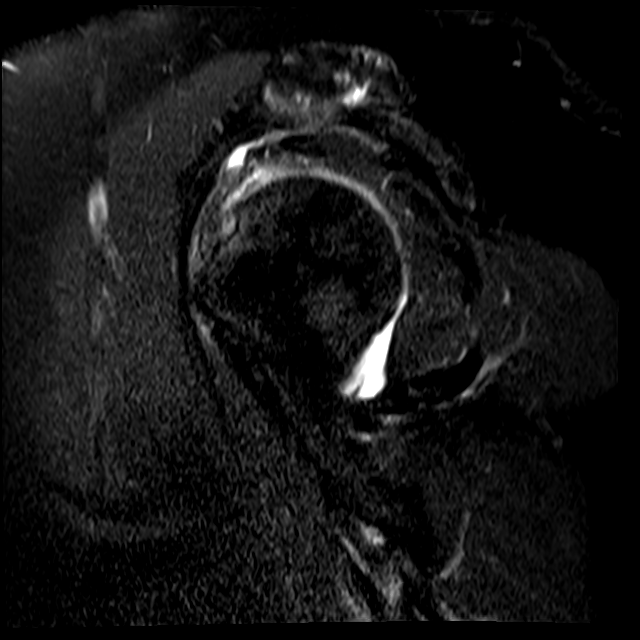
[im 18/22]
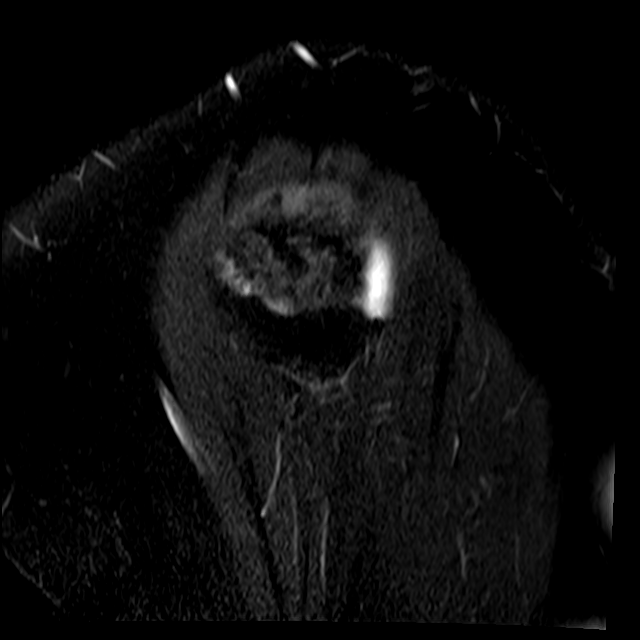

[31 of 40 positions shown; findings below may reference images not displayed]

FINDINGS: Rotator cuff: The patient has rotator cuff tendinopathy with a deep
bursal sided tear of the anterior and far lateral supraspinatus
measuring approximately 1 cm from front to back. No retraction.

Muscles:  No atrophy or focal lesion.

Biceps long head:  Completely torn from the superior labrum.

Acromioclavicular Joint: Moderate osteoarthritis. Type 2 acromion.
There is subacromial spurring. A small volume of fluid is seen in
the subacromial/subdeltoid bursa.

Glenohumeral Joint: Negative.

Labrum:  The superior labrum is degenerated and blunted.

Bones: No acute bony or joint abnormality. The appearance of the
greater tuberosity is suggestive of remote healed fracture.

Other: None.
IMPRESSION: Rotator cuff tendinopathy with a deep bursal sided tear of the
anterior and far lateral supraspinatus measuring 1 cm from front to
back. No retraction or atrophy.

Complete tear of the long head of biceps from the superior labrum.

Moderate acromioclavicular osteoarthritis. Type 2 acromion with
subacromial spurring also noted.

Blunted and degenerated superior labrum.

Small volume of subacromial/subdeltoid fluid compatible with
bursitis.

Remote healed fracture of the greater tuberosity of the humerus.

## 2022-06-10 ENCOUNTER — Encounter: Payer: Self-pay | Admitting: Gastroenterology

## 2022-06-15 ENCOUNTER — Ambulatory Visit
Admission: RE | Admit: 2022-06-15 | Discharge: 2022-06-15 | Disposition: A | Discharge status: Home / Self Care | Payer: Medicare HMO | Attending: Gastroenterology | Admitting: Gastroenterology

## 2022-06-15 ENCOUNTER — Ambulatory Visit: Payer: Medicare HMO | Admitting: Certified Registered Nurse Anesthetist

## 2022-06-15 ENCOUNTER — Encounter: Payer: Self-pay | Admitting: Gastroenterology

## 2022-06-15 ENCOUNTER — Encounter
Admission: RE | Disposition: A | Discharge status: Home / Self Care | Payer: Self-pay | Source: Home / Self Care | Attending: Gastroenterology

## 2022-06-15 DIAGNOSIS — J45909 Unspecified asthma, uncomplicated: Secondary | ICD-10-CM | POA: Insufficient documentation

## 2022-06-15 DIAGNOSIS — D122 Benign neoplasm of ascending colon: Secondary | ICD-10-CM | POA: Insufficient documentation

## 2022-06-15 DIAGNOSIS — Z79899 Other long term (current) drug therapy: Secondary | ICD-10-CM | POA: Insufficient documentation

## 2022-06-15 DIAGNOSIS — Z7951 Long term (current) use of inhaled steroids: Secondary | ICD-10-CM | POA: Diagnosis not present

## 2022-06-15 DIAGNOSIS — D124 Benign neoplasm of descending colon: Secondary | ICD-10-CM | POA: Diagnosis not present

## 2022-06-15 DIAGNOSIS — K640 First degree hemorrhoids: Secondary | ICD-10-CM | POA: Insufficient documentation

## 2022-06-15 DIAGNOSIS — I110 Hypertensive heart disease with heart failure: Secondary | ICD-10-CM | POA: Insufficient documentation

## 2022-06-15 DIAGNOSIS — E890 Postprocedural hypothyroidism: Secondary | ICD-10-CM | POA: Insufficient documentation

## 2022-06-15 DIAGNOSIS — K317 Polyp of stomach and duodenum: Secondary | ICD-10-CM | POA: Diagnosis not present

## 2022-06-15 DIAGNOSIS — K2970 Gastritis, unspecified, without bleeding: Secondary | ICD-10-CM | POA: Insufficient documentation

## 2022-06-15 DIAGNOSIS — Z8542 Personal history of malignant neoplasm of other parts of uterus: Secondary | ICD-10-CM | POA: Diagnosis not present

## 2022-06-15 DIAGNOSIS — K621 Rectal polyp: Secondary | ICD-10-CM | POA: Diagnosis not present

## 2022-06-15 DIAGNOSIS — I509 Heart failure, unspecified: Secondary | ICD-10-CM | POA: Insufficient documentation

## 2022-06-15 DIAGNOSIS — Z85850 Personal history of malignant neoplasm of thyroid: Secondary | ICD-10-CM | POA: Diagnosis not present

## 2022-06-15 DIAGNOSIS — K5730 Diverticulosis of large intestine without perforation or abscess without bleeding: Secondary | ICD-10-CM | POA: Diagnosis not present

## 2022-06-15 DIAGNOSIS — Z79890 Hormone replacement therapy (postmenopausal): Secondary | ICD-10-CM | POA: Insufficient documentation

## 2022-06-15 DIAGNOSIS — Z1211 Encounter for screening for malignant neoplasm of colon: Secondary | ICD-10-CM | POA: Diagnosis present

## 2022-06-15 DIAGNOSIS — K219 Gastro-esophageal reflux disease without esophagitis: Secondary | ICD-10-CM | POA: Insufficient documentation

## 2022-06-15 DIAGNOSIS — K2289 Other specified disease of esophagus: Secondary | ICD-10-CM | POA: Diagnosis not present

## 2022-06-15 DIAGNOSIS — F17200 Nicotine dependence, unspecified, uncomplicated: Secondary | ICD-10-CM | POA: Diagnosis not present

## 2022-06-15 HISTORY — PX: COLONOSCOPY: SHX5424

## 2022-06-15 HISTORY — PX: ESOPHAGOGASTRODUODENOSCOPY: SHX5428

## 2022-06-15 SURGERY — COLONOSCOPY
Anesthesia: General

## 2022-06-15 MED ORDER — PROPOFOL 10 MG/ML IV BOLUS
INTRAVENOUS | Status: DC | PRN
  Administered 2022-06-15: 60 mg via INTRAVENOUS

## 2022-06-15 MED ORDER — SODIUM CHLORIDE 0.9 % IV SOLN
INTRAVENOUS | Status: DC
  Administered 2022-06-15: 20 mL/h via INTRAVENOUS

## 2022-06-15 MED ORDER — LIDOCAINE HCL (PF) 2 % IJ SOLN
INTRAMUSCULAR | Status: AC
  Filled 2022-06-15: qty 5

## 2022-06-15 MED ORDER — PHENYLEPHRINE 80 MCG/ML (10ML) SYRINGE FOR IV PUSH (FOR BLOOD PRESSURE SUPPORT)
PREFILLED_SYRINGE | INTRAVENOUS | Status: AC
  Filled 2022-06-15: qty 10

## 2022-06-15 MED ORDER — PROPOFOL 1000 MG/100ML IV EMUL
INTRAVENOUS | Status: AC
  Filled 2022-06-15: qty 100

## 2022-06-15 MED ORDER — LIDOCAINE HCL (CARDIAC) PF 100 MG/5ML IV SOSY
PREFILLED_SYRINGE | INTRAVENOUS | Status: DC | PRN
  Administered 2022-06-15: 100 mg via INTRAVENOUS

## 2022-06-15 MED ORDER — PROPOFOL 500 MG/50ML IV EMUL
INTRAVENOUS | Status: DC | PRN
  Administered 2022-06-15: 150 ug/kg/min via INTRAVENOUS

## 2022-06-15 MED ORDER — PHENYLEPHRINE HCL (PRESSORS) 10 MG/ML IV SOLN
INTRAVENOUS | Status: DC | PRN
  Administered 2022-06-15: 40 ug via INTRAVENOUS
  Administered 2022-06-15 (×3): 80 ug via INTRAVENOUS

## 2022-06-15 NOTE — Interval H&P Note (Signed)
History and Physical Interval Note: Preprocedure H&P from 06/15/22  was reviewed and there was no interval change after seeing and examining the patient.  Written consent was obtained from the patient after discussion of risks, benefits, and alternatives. Patient has consented to proceed with Esophagogastroduodenoscopy and Colonoscopy with possible intervention   06/15/2022 9:00 AM  [name]  has presented today for surgery, with the diagnosis of Colon cancer screening (Z12.11) GERD without esophagitis (K21.9) Pharyngoesophageal dysphagia (R13.14).  The various methods of treatment have been discussed with the patient and family. After consideration of risks, benefits and other options for treatment, the patient has consented to  Procedure(s): COLONOSCOPY (N/A) ESOPHAGOGASTRODUODENOSCOPY (EGD) (N/A) as a surgical intervention.  The patient's history has been reviewed, patient examined, no change in status, stable for surgery.  I have reviewed the patient's chart and labs.  Questions were answered to the patient's satisfaction.     Diana Dillon

## 2022-06-15 NOTE — Op Note (Signed)
[facility] Gastroenterology Patient Name: Diana Dillon Procedure Date: 06/15/2022 8:57 AM MRN: [id number] Account #: [id number] Date of Birth: [birth year] Admit Type: Outpatient Age: 67 Room: [facility] ENDO ROOM 2 Gender: Female Note Status: Finalized Instrument Name: Upper Endoscope [id number] Procedure:             Upper GI endoscopy Indications:           Dysphagia Providers:             Jaynie Collins DO, DO Medicines:             Monitored Anesthesia Care Complications:         No immediate complications. Estimated blood loss:                         Minimal. Procedure:             Pre-Anesthesia Assessment:                        - Prior to the procedure, a History and Physical was                         performed, and patient medications and allergies were                         reviewed. The patient is competent. The risks and                         benefits of the procedure and the sedation options and                         risks were discussed with the patient. All questions                         were answered and informed consent was obtained.                         Patient identification and proposed procedure were                         verified by the physician, the nurse, the anesthetist                         and the technician in the endoscopy suite. Mental                         Status Examination: alert and oriented. Airway                         Examination: normal oropharyngeal airway and neck                         mobility. Respiratory Examination: clear to                         auscultation. CV Examination: RRR, no murmurs, no S3                         or S4. Prophylactic Antibiotics: The patient does not  require prophylactic antibiotics. Prior                         Anticoagulants: The patient has taken no anticoagulant                         or antiplatelet agents. ASA Grade Assessment: III - A                          patient with severe systemic disease. After reviewing                         the risks and benefits, the patient was deemed in                         satisfactory condition to undergo the procedure. The                         anesthesia plan was to use monitored anesthesia care                         (MAC). Immediately prior to administration of                         medications, the patient was re-assessed for adequacy                         to receive sedatives. The heart rate, respiratory                         rate, oxygen saturations, blood pressure, adequacy of                         pulmonary ventilation, and response to care were                         monitored throughout the procedure. The physical                         status of the patient was re-assessed after the                         procedure.                        After obtaining informed consent, the endoscope was                         passed under direct vision. Throughout the procedure,                         the patient's blood pressure, pulse, and oxygen                         saturations were monitored continuously. The Endoscope                         was introduced through the mouth, and advanced to the  second part of duodenum. The upper GI endoscopy was                         accomplished without difficulty. The patient tolerated                         the procedure well. Findings:      The duodenal bulb, first portion of the duodenum and second portion of       the duodenum were normal. Estimated blood loss: none.      Localized mild inflammation characterized by erosions and erythema was       found in the gastric antrum. Biopsies were taken with a cold forceps for       Helicobacter pylori testing. Estimated blood loss was minimal.      Multiple 1 to 3 mm sessile polyps with no bleeding and no stigmata of       recent bleeding were found in  the gastric fundus. Estimated blood loss:       none. consistent with benign fundic gland polyps      The Z-line was irregular. Biopsies were taken with a cold forceps for       histology. Estimated blood loss was minimal.      Normal mucosa was found in the entire esophagus. The scope was       withdrawn. Dilation was performed with a Maloney dilator with no       resistance at 52 Fr. The dilation site was examined following endoscope       reinsertion and showed no change. Estimated blood loss: none. Biopsies       were taken with a cold forceps for histology. Biopsies from mid       esophagus to rule out EOE. Estimated blood loss was minimal.      The exam of the esophagus was otherwise normal. Impression:            - Normal duodenal bulb, first portion of the duodenum                         and second portion of the duodenum.                        - Gastritis. Biopsied.                        - Multiple gastric polyps.                        - Z-line irregular. Biopsied.                        - Normal mucosa was found in the entire esophagus.                         Dilated. Biopsied. Recommendation:        - Patient has a contact number available for                         emergencies. The signs and symptoms of potential                         delayed complications were discussed with  the patient.                         Return to normal activities tomorrow. Written                         discharge instructions were provided to the patient.                        - Discharge patient to home.                        - Resume previous diet.                        - Continue present medications.                        - Await pathology results.                        - Return to GI clinic as previously scheduled.                        - proceed with colonoscopy                        - The findings and recommendations were discussed with                         the  patient. Procedure Code(s):     --- Professional ---                        [phone], Esophagogastroduodenoscopy, flexible,                         transoral; with biopsy, single or multiple                        43450, Dilation of esophagus, by unguided sound or                         bougie, single or multiple passes Diagnosis Code(s):     --- Professional ---                        K29.70, Gastritis, unspecified, without bleeding                        K31.7, Polyp of stomach and duodenum                        K22.89, Other specified disease of esophagus                        R13.10, Dysphagia, unspecified CPT copyright 2022 American Medical Association. All rights reserved. The codes documented in this report are preliminary and upon coder review may  be revised to meet current compliance requirements. Attending Participation:      I personally performed the entire procedure. Elfredia Nevins, DO Jaynie Collins DO, DO 06/15/2022 9:27:37 AM This report has been signed electronically. Number of Addenda: 0 Note Initiated  On: 06/15/2022 8:57 AM Estimated Blood Loss:  Estimated blood loss was minimal.      [facility]

## 2022-06-15 NOTE — Transfer of Care (Signed)
Immediate Anesthesia Transfer of Care Note  Patient: Diana Dillon  Procedure(s) Performed: COLONOSCOPY ESOPHAGOGASTRODUODENOSCOPY (EGD)  Patient Location: PACU  Anesthesia Type:General  Level of Consciousness: drowsy  Airway & Oxygen Therapy: Patient Spontanous Breathing and Patient connected to nasal cannula oxygen  Post-op Assessment: Report given to RN and Post -op Vital signs reviewed and stable  Post vital signs: Reviewed and stable  Last Vitals:  Vitals Value Taken Time  BP 94/60 06/15/22 0951  Temp 35.9 C 06/15/22 0951  Pulse 70 06/15/22 0951  Resp 18 06/15/22 0951  SpO2 96 % 06/15/22 0951  Vitals shown include unvalidated device data.  Last Pain:  Vitals:   06/15/22 0951  TempSrc: Tympanic  PainSc: Asleep         Complications: No notable events documented.

## 2022-06-15 NOTE — Anesthesia Preprocedure Evaluation (Signed)
Anesthesia Evaluation  Patient identified by MRN, date of birth, ID band Patient awake    Reviewed: Allergy & Precautions, NPO status , Patient's Chart, lab work & pertinent test results  History of Anesthesia Complications Negative for: history of anesthetic complications  Airway Mallampati: III  TM Distance: >3 FB Neck ROM: full    Dental no notable dental hx.    Pulmonary shortness of breath and with exertion, asthma , Current Smoker   Pulmonary exam normal        Cardiovascular hypertension, On Medications +CHF  Normal cardiovascular exam     Neuro/Psych negative neurological ROS  negative psych ROS   GI/Hepatic Neg liver ROS,GERD  Medicated,,  Endo/Other  Hypothyroidism  Morbid obesity  Renal/GU Renal disease  negative genitourinary   Musculoskeletal   Abdominal   Peds  Hematology negative hematology ROS (+)   Anesthesia Other Findings Past Medical History: No date: Asthma No date: CHF (congestive heart failure) No date: Dyspnea No date: GERD (gastroesophageal reflux disease) No date: Hypertension No date: Hypothyroidism No date: Thyroid cancer     Comment:  in remission No date: Uterine cancer     Comment:  in remission  Past Surgical History: No date: ABDOMINAL HYSTERECTOMY 08/06/2021: CARPAL TUNNEL RELEASE; Right     Comment:  Procedure: CARPAL TUNNEL RELEASE ENDOSCOPIC;  Surgeon:               Christena Flake, MD;  Location: [facility] ORS;  Service:               Orthopedics;  Laterality: Right; No date: CESAREAN SECTION     Comment:  x 2 No date: COLONOSCOPY No date: THYROIDECTOMY; Right     Comment:  partial  BMI    Body Mass Index: 39.09 kg/m      Reproductive/Obstetrics negative OB ROS                             Anesthesia Physical Anesthesia Plan  ASA: 3  Anesthesia Plan: General   Post-op Pain Management: Minimal or no pain anticipated   Induction:  Intravenous  PONV Risk Score and Plan: 2 and Propofol infusion and TIVA  Airway Management Planned: Natural Airway and Nasal Cannula  Additional Equipment:   Intra-op Plan:   Post-operative Plan:   Informed Consent: I have reviewed the patients History and Physical, chart, labs and discussed the procedure including the risks, benefits and alternatives for the proposed anesthesia with the patient or authorized representative who has indicated his/her understanding and acceptance.     Dental Advisory Given  Plan Discussed with: Anesthesiologist, CRNA and Surgeon  Anesthesia Plan Comments: (Patient consented for risks of anesthesia including but not limited to:  - adverse reactions to medications - risk of airway placement if required - damage to eyes, teeth, lips or other oral mucosa - nerve damage due to positioning  - sore throat or hoarseness - Damage to heart, brain, nerves, lungs, other parts of body or loss of life  Patient voiced understanding.)        Anesthesia Quick Evaluation

## 2022-06-15 NOTE — Op Note (Signed)
[facility] Gastroenterology Patient Name: Diana Dillon Procedure Date: 06/15/2022 8:57 AM MRN: [id number] Account #: [id number] Date of Birth: Nov 08, 1955 Admit Type: Outpatient Age: 67 Room: [facility] ENDO ROOM 2 Gender: Female Note Status: Finalized Instrument Name: Colonscope [id number] Procedure:             Colonoscopy Indications:           Screening for colorectal malignant neoplasm Providers:             Jaynie Collins DO, DO Medicines:             Monitored Anesthesia Care Complications:         No immediate complications. Estimated blood loss:                         Minimal. Procedure:             Pre-Anesthesia Assessment:                        - Prior to the procedure, a History and Physical was                         performed, and patient medications and allergies were                         reviewed. The patient is competent. The risks and                         benefits of the procedure and the sedation options and                         risks were discussed with the patient. All questions                         were answered and informed consent was obtained.                         Patient identification and proposed procedure were                         verified by the physician, the nurse, the anesthetist                         and the technician in the endoscopy suite. Mental                         Status Examination: alert and oriented. Airway                         Examination: normal oropharyngeal airway and neck                         mobility. Respiratory Examination: clear to                         auscultation. CV Examination: RRR, no murmurs, no S3                         or S4. Prophylactic Antibiotics: The patient does not  require prophylactic antibiotics. Prior                         Anticoagulants: The patient has taken no anticoagulant                         or antiplatelet agents. ASA Grade  Assessment: III - A                         patient with severe systemic disease. After reviewing                         the risks and benefits, the patient was deemed in                         satisfactory condition to undergo the procedure. The                         anesthesia plan was to use monitored anesthesia care                         (MAC). Immediately prior to administration of                         medications, the patient was re-assessed for adequacy                         to receive sedatives. The heart rate, respiratory                         rate, oxygen saturations, blood pressure, adequacy of                         pulmonary ventilation, and response to care were                         monitored throughout the procedure. The physical                         status of the patient was re-assessed after the                         procedure.                        After obtaining informed consent, the colonoscope was                         passed under direct vision. Throughout the procedure,                         the patient's blood pressure, pulse, and oxygen                         saturations were monitored continuously. The                         Colonoscope was introduced through the anus and  advanced to the the cecum, identified by appendiceal                         orifice and ileocecal valve. The colonoscopy was                         performed without difficulty. The patient tolerated                         the procedure well. The quality of the bowel                         preparation was evaluated using the BBPS [facility] Bowel                         Preparation Scale) with scores of: Right Colon = 1                         (portion of mucosa seen, but other areas not well seen                         due to staining, residual stool and/or opaque liquid),                         Transverse Colon = 1 (portion of mucosa seen,  but                         other areas not well seen due to staining, residual                         stool and/or opaque liquid) and Left Colon = 2 (minor                         amount of residual staining, small fragments of stool                         and/or opaque liquid, but mucosa seen well). The total                         BBPS score equals 4. The quality of the bowel                         preparation was inadequate. large amount of food                         residue in the right and transverse colon interfering                         with adequate visualization of the mucosa. The                         ileocecal valve, appendiceal orifice, and rectum were                         photographed. Findings:      The perianal and digital rectal examinations were normal. Pertinent  negatives include normal sphincter tone.      A large amount of semi-solid food residue/ stool was found in the       transverse colon, in the ascending colon and in the cecum, precluding       visualization. Lavage of the area was performed using a moderate amount,       resulting in incomplete clearance with fair visualization. Estimated       blood loss: none.      Non-bleeding internal hemorrhoids were found during retroflexion. The       hemorrhoids were Grade I (internal hemorrhoids that do not prolapse).       Estimated blood loss: none.      Scattered small-mouthed diverticula were found in the sigmoid colon.       Estimated blood loss: none.      Four sessile polyps were found in the rectum (1), descending colon (1)       and ascending colon (2). The polyps were 3 to 4 mm in size. These polyps       were removed with a cold snare. Resection and retrieval were complete.       Estimated blood loss was minimal.      Four sessile polyps were found in the sigmoid colon (3) and descending       colon (1). The polyps were 1 to 2 mm in size. These polyps were removed       with a jumbo  cold forceps. Resection and retrieval were complete.       Estimated blood loss was minimal.      The exam was otherwise without abnormality on direct and retroflexion       views. Impression:            - Preparation of the colon was inadequate.                        - Stool in the transverse colon, in the ascending                         colon and in the cecum.                        - Non-bleeding internal hemorrhoids.                        - Diverticulosis in the sigmoid colon.                        - Four 3 to 4 mm polyps in the rectum, in the                         descending colon and in the ascending colon, removed                         with a cold snare. Resected and retrieved.                        - Four 1 to 2 mm polyps in the sigmoid colon and in                         the descending colon, removed with a jumbo cold  forceps. Resected and retrieved.                        - The examination was otherwise normal on direct and                         retroflexion views. Recommendation:        - Patient has a contact number available for                         emergencies. The signs and symptoms of potential                         delayed complications were discussed with the patient.                         Return to normal activities tomorrow. Written                         discharge instructions were provided to the patient.                        - Discharge patient to home.                        - Resume previous diet.                        - Continue present medications.                        - Await pathology results.                        - Repeat colonoscopy 6-12 months because the bowel                         preparation was poor.                        - Return to GI office as previously scheduled.                        - No ibuprofen, naproxen, or other non-steroidal                         anti-inflammatory drugs for 5 days  after polyp removal.                        - The findings and recommendations were discussed with                         the patient. Procedure Code(s):     --- Professional ---                        [phone], Colonoscopy, flexible; with removal of                         tumor(s), polyp(s), or other lesion(s) by snare  technique                        [id number], 59, Colonoscopy, flexible; with biopsy, single                         or multiple Diagnosis Code(s):     --- Professional ---                        Z12.11, Encounter for screening for malignant neoplasm                         of colon                        K64.0, First degree hemorrhoids                        D12.8, Benign neoplasm of rectum                        D12.2, Benign neoplasm of ascending colon                        D12.5, Benign neoplasm of sigmoid colon                        D12.4, Benign neoplasm of descending colon                        K57.30, Diverticulosis of large intestine without                         perforation or abscess without bleeding CPT copyright 2022 American Medical Association. All rights reserved. The codes documented in this report are preliminary and upon coder review may  be revised to meet current compliance requirements. Attending Participation:      I personally performed the entire procedure. Elfredia Nevins, DO Jaynie Collins DO, DO 06/15/2022 9:54:20 AM This report has been signed electronically. Number of Addenda: 0 Note Initiated On: 06/15/2022 8:57 AM Scope Withdrawal Time: 0 hours 11 minutes 34 seconds  Total Procedure Duration: 0 hours 17 minutes 43 seconds  Estimated Blood Loss:  Estimated blood loss was minimal.      [facility]

## 2022-06-15 NOTE — H&P (Signed)
Pre-Procedure H&P   Patient ID: Diana HeightCynthia I Dillon is a 67 y.o. female.  Gastroenterology Provider: Jaynie CollinsSteven Michael Jalisa Sacco, DO  Referring Provider: Jacob MooresMason Croley, PA PCP: Dione Housekeeperlmedo, Mario Ernesto, MD  Date: 06/15/2022  HPI [name] is a 67 y.o. female who presents today for Esophagogastroduodenoscopy and Colonoscopy for Dysphagia, GERD, colorectal cancer screening .  Pt notes dysphagia to solids and liquids. This has been going on for years.  She reports sticking sensation above the suprasternal notch.  No odynophagia.  Notes her swallowing issues are worse status post thyroidectomy.  Acid reflux well-controlled on PPI  Last underwent colonoscopy in 2011 which reportedly negative.  She has 1-2 bowel movements a day.  No melena or hematochezia.  She notes some abdominal discomfort at times is relieved by having a bowel movement or passing flatus.  History of hysterectomy and C-section  Past Medical History:  Diagnosis Date   Asthma    CHF (congestive heart failure)    Dyspnea    GERD (gastroesophageal reflux disease)    Hypertension    Hypothyroidism    Thyroid cancer    in remission   Uterine cancer    in remission    Past Surgical History:  Procedure Laterality Date   ABDOMINAL HYSTERECTOMY     CARPAL TUNNEL RELEASE Right 08/06/2021   Procedure: CARPAL TUNNEL RELEASE ENDOSCOPIC;  Surgeon: Christena FlakePoggi, John J, MD;  Location: [facility] ORS;  Service: Orthopedics;  Laterality: Right;   CESAREAN SECTION     x 2   COLONOSCOPY     THYROIDECTOMY Right    partial    Family History No h/o GI disease or malignancy  Review of Systems  Constitutional:  Negative for activity change, appetite change, chills, diaphoresis, fatigue, fever and unexpected weight change.  HENT:  Positive for trouble swallowing. Negative for voice change.   Respiratory:  Negative for shortness of breath and wheezing.   Cardiovascular:  Negative for chest pain, palpitations and leg swelling.   Gastrointestinal:  Positive for abdominal pain and constipation. Negative for abdominal distention, anal bleeding, blood in stool, diarrhea, nausea, rectal pain and vomiting.  Musculoskeletal:  Negative for arthralgias and myalgias.  Skin:  Negative for color change and pallor.  Neurological:  Negative for dizziness, syncope and weakness.  Psychiatric/Behavioral:  Negative for confusion.   All other systems reviewed and are negative.    Medications No current facility-administered medications on file prior to encounter.   Current Outpatient Medications on File Prior to Encounter  Medication Sig Dispense Refill   acetaminophen (TYLENOL) 500 MG tablet Take by mouth.     albuterol (PROVENTIL HFA;VENTOLIN HFA) 108 (90 BASE) MCG/ACT inhaler Inhale into the lungs every 6 (six) hours as needed for wheezing or shortness of breath.     diclofenac Sodium (VOLTAREN) 1 % GEL Apply 1/2 to 1 inch strip to hand and massage thoroughly TID     diphenhydramine-acetaminophen (TYLENOL PM EXTRA STRENGTH) 25-500 MG TABS tablet Take 2 tablets by mouth at bedtime as needed.     fluticasone (FLONASE) 50 MCG/ACT nasal spray Place into the nose.     furosemide (LASIX) 20 MG tablet Take by mouth as needed.     gabapentin (NEURONTIN) 300 MG capsule Take 300 mg by mouth daily.     glucose blood (PRECISION QID TEST) test strip Use 3 (three) times daily Use as instructed.     JARDIANCE 10 MG TABS tablet Take 10 mg by mouth daily.     levothyroxine (SYNTHROID,  LEVOTHROID) 75 MCG tablet Take 75 mcg by mouth daily before breakfast.     montelukast (SINGULAIR) 10 MG tablet Take 10 mg by mouth at bedtime.     omeprazole (PRILOSEC) 20 MG capsule Take 10 mg by mouth daily.     ondansetron (ZOFRAN-ODT) 4 MG disintegrating tablet Take 1 tablet (4 mg total) by mouth every 8 (eight) hours as needed for nausea or vomiting. 20 tablet 0   phentermine (ADIPEX-P) 37.5 MG tablet Take 37.5 mg by mouth every morning.     SYMBICORT  160-4.5 MCG/ACT inhaler SMARTSIG:2 Puff(s) By Mouth Twice Daily     telmisartan (MICARDIS) 80 MG tablet Take 80 mg by mouth daily.     dicyclomine (BENTYL) 20 MG tablet Take 1 tablet (20 mg total) by mouth daily for 10 days. 10 tablet 0    Pertinent medications related to GI and procedure were reviewed by me with the patient prior to the procedure   Current Facility-Administered Medications:    0.9 %  sodium chloride infusion, , Intravenous, Continuous, Jaynie Collins, DO, Last Rate: 20 mL/hr at 06/15/22 0853, 20 mL/hr at 06/15/22 2458      Allergies  Allergen Reactions   Codeine Hives    Lightheadedness  Other reaction(s): Hyperactive Behavior, Not Indicated, Other (see comments), Rash-Hives Reports blood clots/fever/chills Lightheadedness  Reports blood clots/fever/chills    Amlodipine Swelling   Allergies were reviewed by me prior to the procedure  Objective   Body mass index is 39.09 kg/m. Vitals:   06/15/22 0841  BP: 116/67  Pulse: 83  Resp: 20  Temp: 97.7 F (36.5 C)  TempSrc: Temporal  SpO2: 99%  Weight: 94.6 kg  Height: 5' 1.25" (1.556 m)     Physical Exam Vitals and nursing note reviewed.  Constitutional:      General: She is not in acute distress.    Appearance: Normal appearance. She is obese. She is not ill-appearing, toxic-appearing or diaphoretic.  HENT:     Head: Normocephalic and atraumatic.     Comments: Scar from previous thyroidectomy    Nose: Nose normal.     Mouth/Throat:     Mouth: Mucous membranes are moist.     Pharynx: Oropharynx is clear.  Eyes:     General: No scleral icterus.    Extraocular Movements: Extraocular movements intact.  Cardiovascular:     Rate and Rhythm: Normal rate and regular rhythm.     Heart sounds: Normal heart sounds. No murmur heard.    No friction rub. No gallop.  Pulmonary:     Effort: Pulmonary effort is normal. No respiratory distress.     Breath sounds: Normal breath sounds. No wheezing,  rhonchi or rales.  Abdominal:     General: Bowel sounds are normal. There is no distension.     Palpations: Abdomen is soft.     Tenderness: There is no abdominal tenderness. There is no guarding or rebound.  Musculoskeletal:     Cervical back: Neck supple.     Right lower leg: No edema.     Left lower leg: No edema.  Skin:    General: Skin is warm and dry.     Coloration: Skin is not jaundiced or pale.  Neurological:     General: No focal deficit present.     Mental Status: She is alert and oriented to person, place, and time. Mental status is at baseline.  Psychiatric:        Mood and Affect: Mood normal.  Behavior: Behavior normal.        Thought Content: Thought content normal.        Judgment: Judgment normal.      Assessment:  Ms. SAROJ STANARD is a 67 y.o. female  who presents today for Esophagogastroduodenoscopy and Colonoscopy for Dysphagia, GERD, colorectal cancer screening .  Plan:  Esophagogastroduodenoscopy and Colonoscopy with possible intervention today  Esophagogastroduodenoscopy and Colonoscopy with possible biopsy, control of bleeding, polypectomy, and interventions as necessary has been discussed with the patient/patient representative. Informed consent was obtained from the patient/patient representative after explaining the indication, nature, and risks of the procedure including but not limited to death, bleeding, perforation, missed neoplasm/lesions, cardiorespiratory compromise, and reaction to medications. Opportunity for questions was given and appropriate answers were provided. Patient/patient representative has verbalized understanding is amenable to undergoing the procedure.   Jaynie Collins, DO  [facility] Gastroenterology  Portions of the record may have been created with voice recognition software. Occasional wrong-word or 'sound-a-like' substitutions may have occurred due to the inherent limitations of voice recognition  software.  Read the chart carefully and recognize, using context, where substitutions may have occurred.

## 2022-06-15 NOTE — Anesthesia Postprocedure Evaluation (Signed)
Anesthesia Post Note  Patient: Diana Dillon  Procedure(s) Performed: COLONOSCOPY ESOPHAGOGASTRODUODENOSCOPY (EGD)  Patient location during evaluation: Endoscopy Anesthesia Type: General Level of consciousness: awake and alert Pain management: pain level controlled Vital Signs Assessment: post-procedure vital signs reviewed and stable Respiratory status: spontaneous breathing, nonlabored ventilation, respiratory function stable and patient connected to nasal cannula oxygen Cardiovascular status: blood pressure returned to baseline and stable Postop Assessment: no apparent nausea or vomiting Anesthetic complications: no   No notable events documented.   Last Vitals:  Vitals:   06/15/22 0841 06/15/22 0951  BP: 116/67 94/60  Pulse: 83 71  Resp: 20 18  Temp: 36.5 C (!) 35.9 C  SpO2: 99% 96%    Last Pain:  Vitals:   06/15/22 0951  TempSrc: Tympanic  PainSc: Asleep                 Louie Boston

## 2022-06-16 ENCOUNTER — Encounter: Payer: Self-pay | Admitting: Gastroenterology

## 2022-06-16 LAB — SURGICAL PATHOLOGY

## 2022-09-02 ENCOUNTER — Emergency Department
Admission: EM | Admit: 2022-09-02 | Discharge: 2022-09-02 | Disposition: A | Discharge status: Home / Self Care | Payer: Medicare HMO | Attending: Emergency Medicine | Admitting: Emergency Medicine

## 2022-09-02 ENCOUNTER — Other Ambulatory Visit: Payer: Self-pay

## 2022-09-02 ENCOUNTER — Emergency Department: Payer: Medicare HMO

## 2022-09-02 ENCOUNTER — Ambulatory Visit
Admission: EM | Admit: 2022-09-02 | Discharge: 2022-09-02 | Disposition: A | Discharge status: Home / Self Care | Payer: Medicare HMO | Attending: Physician Assistant | Admitting: Physician Assistant

## 2022-09-02 DIAGNOSIS — M79605 Pain in left leg: Secondary | ICD-10-CM | POA: Diagnosis not present

## 2022-09-02 DIAGNOSIS — M7989 Other specified soft tissue disorders: Secondary | ICD-10-CM | POA: Diagnosis not present

## 2022-09-02 DIAGNOSIS — M79662 Pain in left lower leg: Secondary | ICD-10-CM | POA: Insufficient documentation

## 2022-09-02 DIAGNOSIS — N1831 Chronic kidney disease, stage 3a: Secondary | ICD-10-CM | POA: Diagnosis not present

## 2022-09-02 DIAGNOSIS — I130 Hypertensive heart and chronic kidney disease with heart failure and stage 1 through stage 4 chronic kidney disease, or unspecified chronic kidney disease: Secondary | ICD-10-CM | POA: Insufficient documentation

## 2022-09-02 DIAGNOSIS — I509 Heart failure, unspecified: Secondary | ICD-10-CM | POA: Diagnosis not present

## 2022-09-02 LAB — CBC WITH DIFFERENTIAL/PLATELET
Abs Immature Granulocytes: 0.02 10*3/uL (ref 0.00–0.07)
Basophils Absolute: 0.1 10*3/uL (ref 0.0–0.1)
Basophils Relative: 1 %
Eosinophils Absolute: 0.3 10*3/uL (ref 0.0–0.5)
Eosinophils Relative: 3 %
HCT: 40 % (ref 36.0–46.0)
Hemoglobin: 12.9 g/dL (ref 12.0–15.0)
Immature Granulocytes: 0 %
Lymphocytes Relative: 23 %
Lymphs Abs: 2.2 10*3/uL (ref 0.7–4.0)
MCH: 31.1 pg (ref 26.0–34.0)
MCHC: 32.3 g/dL (ref 30.0–36.0)
MCV: 96.4 fL (ref 80.0–100.0)
Monocytes Absolute: 1 10*3/uL (ref 0.1–1.0)
Monocytes Relative: 10 %
Neutro Abs: 6.2 10*3/uL (ref 1.7–7.7)
Neutrophils Relative %: 63 %
Platelets: 301 10*3/uL (ref 150–400)
RBC: 4.15 MIL/uL (ref 3.87–5.11)
RDW: 14.6 % (ref 11.5–15.5)
WBC: 9.8 10*3/uL (ref 4.0–10.5)
nRBC: 0 % (ref 0.0–0.2)

## 2022-09-02 LAB — BASIC METABOLIC PANEL
Anion gap: 10 (ref 5–15)
BUN: 28 mg/dL — ABNORMAL HIGH (ref 8–23)
CO2: 24 mmol/L (ref 22–32)
Calcium: 8.8 mg/dL — ABNORMAL LOW (ref 8.9–10.3)
Chloride: 103 mmol/L (ref 98–111)
Creatinine, Ser: 1.64 mg/dL — ABNORMAL HIGH (ref 0.44–1.00)
GFR, Estimated: 34 mL/min — ABNORMAL LOW (ref >60)
Glucose, Bld: 110 mg/dL — ABNORMAL HIGH (ref 70–99)
Potassium: 3.8 mmol/L (ref 3.5–5.1)
Sodium: 137 mmol/L (ref 135–145)

## 2022-09-02 MED ORDER — OXYCODONE-ACETAMINOPHEN 5-325 MG PO TABS
1.0000 | ORAL_TABLET | Freq: Once | ORAL | Status: AC
  Administered 2022-09-02: 1 via ORAL
  Filled 2022-09-02: qty 1

## 2022-09-02 MED ORDER — KETOROLAC TROMETHAMINE 30 MG/ML IJ SOLN
30.0000 mg | Freq: Once | INTRAMUSCULAR | Status: AC
  Administered 2022-09-02: 30 mg via INTRAMUSCULAR

## 2022-09-02 NOTE — ED Provider Notes (Signed)
[facility] Provider Note    Event Date/Time   First MD Initiated Contact with Patient 09/02/22 1447     (approximate)   History   Leg Pain   HPI  Diana Dillon is a 67 y.o. female with PMH of heart failure, CKD stage IIIa, prediabetes and HTN who presents for evaluation of left lower leg pain.  Patient tripped over a curb today and hopped on her leg several times to avoid falling.  She describes her leg is feeling like it is cramping, stiff and hard.  She did not fall or hit her head.  She went to urgent care first and was offered x-rays but declined and decided to come to the ED.      Physical Exam   Triage Vital Signs: ED Triage Vitals  Enc Vitals Group     BP 09/02/22 1152 97/81     Pulse Rate 09/02/22 1152 84     Resp 09/02/22 1152 20     Temp 09/02/22 1150 98.1 F (36.7 C)     Temp src --      SpO2 09/02/22 1152 95 %     Weight 09/02/22 1150 215 lb (97.5 kg)     Height 09/02/22 1150 5\' 1"  (1.549 m)     Head Circumference --      Peak Flow --      Pain Score 09/02/22 1150 8     Pain Loc --      Pain Edu? --      Excl. in GC? --     Most recent vital signs: Vitals:   09/02/22 1150 09/02/22 1152  BP:  97/81  Pulse:  84  Resp:  20  Temp: 98.1 F (36.7 C)   SpO2:  95%   General: Awake, no distress.  CV:  Good peripheral perfusion.  Peripheral pulses 2+ and regular. Resp:  Normal effort.  Abd:  No distention.  LLE:  Mild calf swelling appreciated when compared to the right side.  Patient is exquisitely tender to palpation.  Unable to perform [facility] test due to patient's pain levels, but there is no palpable defect of the Achilles tendon.  No overlying skin changes on the calf.  ROM of ankle is limited due to pain.  ROM of the knee intact.  Neurovascularly intact.   ED Results / Procedures / Treatments   Labs (all labs ordered are listed, but only abnormal results are displayed) Labs Reviewed  BASIC METABOLIC PANEL -  Abnormal; Notable for the following components:      Result Value   Glucose, Bld 110 (*)    BUN 28 (*)    Creatinine, Ser 1.64 (*)    Calcium 8.8 (*)    GFR, Estimated 34 (*)    All other components within normal limits  CBC WITH DIFFERENTIAL/PLATELET    RADIOLOGY  X-ray of tibia and fibula as well as DVT study completed in the ED today.  I interpreted the images as well as reviewed the radiologist report.   PROCEDURES:  Critical Care performed: No  Procedures   MEDICATIONS ORDERED IN ED: Medications  oxyCODONE-acetaminophen (PERCOCET/ROXICET) 5-325 MG per tablet 1 tablet (1 tablet Oral Given 09/02/22 1155)     IMPRESSION / MDM / ASSESSMENT AND PLAN / ED COURSE  I reviewed the triage vital signs and the nursing notes.  Patient presented to the ED for evaluation of left calf pain. Differential diagnosis includes, but is not limited to, tib/fib fracture, muscle strain, muscle tear, Achilles tendon tear, compartment syndrome, DVT.   Patient's presentation is most consistent with acute complicated illness / injury requiring diagnostic workup.  Left tibia-fibula x-rays obtained in the ED today.  I interpreted the images as well as reviewed the radiologist report which did not show evidence of a fracture or other bony abnormality.  DVT study was completed and I interpreted the images as well as reviewed the radiologist report which was negative for DVT.  CBC WNL.  BMP showed elevated BUN, creatinine and decreased GFR, however patient has history of chronic kidney disease.  On exam patient is exquisitely tender over the calf and I was unable to perform Thompson's test to rule out Achilles tendon tear.  Based on patient's pain level I am suspicious of a possible muscle tear.  I spoke with the on-call orthopedic doc, Dr. Joice Lofts and he advised me to put patient in a tall walking boot and have her follow-up with orthopedics.  Patient was given crutches.  I advised  her to take Tylenol for her pain.  I explained that due to her kidney disease she cannot take any NSAIDs.  I offered patient a couple days worth of narcotics and she declined.  I advised patient to ice and elevate her leg to reduce the swelling.  Patient was agreeable to plan, all questions were answered and she was stable at discharge.     FINAL CLINICAL IMPRESSION(S) / ED DIAGNOSES   Final diagnoses:  Pain of left calf     Rx / DC Orders   ED Discharge Orders     None        Note:  This document was prepared using Dragon voice recognition software and may include unintentional dictation errors.   Cameron Ali, PA-C 09/02/22 1621    Dionne Bucy, MD 09/02/22 2011

## 2022-09-02 NOTE — Discharge Instructions (Addendum)
Follow-up with orthopedics.  You can ice and elevate your leg as needed.   You can take Tylenol as needed for pain.  Do not exceed 3000 mg in a 24-hour period.

## 2022-09-02 NOTE — ED Triage Notes (Signed)
Pt to ED for left for left lower leg pain after tripping up a curb, states feels like leg is cramping/stiff and hard.  Denies hitting head or LOC.  Was seen by UC and sent to ED

## 2022-09-02 NOTE — Discharge Instructions (Signed)
-  You are having severe pain and there is concern for possible calf muscle tear/rupture. You have decided to go to the ER.  -We gave you 30 mg IM ketorolac in clinic for pain relief.

## 2022-09-02 NOTE — ED Provider Notes (Signed)
[facility]    CSN: [id number] Arrival date & time: 09/02/22  1036      History   Chief Complaint Chief Complaint  Patient presents with   Leg Pain    Lt    HPI Diana Dillon is a 67 y.o. female presenting for left lower leg pain and swelling related to injury that occurred this morning prior to arrival to urgent care.  She reports she was going into work, tripped over the curb and tried to catch herself.  She reports she hopped a couple times on her leg and then fell.  She is not sure what part of her leg hit the pavement.  She reports she was initially able to bear weight and walk.  Currently she is in a wheelchair. Patient reports that her pain is significant especially if she tries to get up and walk.  Reports most of her pain is in her calf.  Swelling of the calf and ankle.  Increased pain with dorsi flexion and plantarflexion of foot.  Increased pain with full extension at knee.  Has not taken anything for pain relief.  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  History of hypertension, asthma, heart failure.  Not reporting any breathing difficulty or chest pain. No recent travel or surgery.  No other complaints.  HPI  Past Medical History:  Diagnosis Date   Asthma    CHF (congestive heart failure) (HCC)    Dyspnea    GERD (gastroesophageal reflux disease)    Hypertension    Hypothyroidism    Thyroid cancer (HCC)    in remission   Uterine cancer (HCC)    in remission    Patient Active Problem List   Diagnosis Date Noted   Numbness and tingling in right hand 05/10/2021   Chronic kidney disease, stage 3a (HCC) 09/11/2020   Heart failure, unspecified (HCC) 09/11/2020   Chronic heart failure with preserved ejection fraction (HCC) 12/24/2019   Obesity, Class III, BMI 40-49.9 (morbid obesity) (HCC) 11/14/2019   Dysuria 10/14/2018   Vitamin D deficiency 11/02/2017   Prediabetes 01/23/2016   H/O thyroidectomy 12/10/2014   History of uterine cancer 12/10/2014   H/O:  hysterectomy 12/10/2014   H/O: C-section 12/10/2014   Asthma 12/10/2014   Moderate persistent asthma without complication 12/10/2014   Thyroid nodule 03/10/2012   Hypercholesterolemia 02/29/2012   Essential (primary) hypertension 01/27/2012   GERD (gastroesophageal reflux disease) 01/27/2012   Tobacco use 01/27/2012    Past Surgical History:  Procedure Laterality Date   ABDOMINAL HYSTERECTOMY     CARPAL TUNNEL RELEASE Right 08/06/2021   Procedure: CARPAL TUNNEL RELEASE ENDOSCOPIC;  Surgeon: Christena Flake, MD;  Location: [facility] ORS;  Service: Orthopedics;  Laterality: Right;   CESAREAN SECTION     x 2   COLONOSCOPY     COLONOSCOPY N/A 06/15/2022   Procedure: COLONOSCOPY;  Surgeon: Jaynie Collins, DO;  Location: [facility] ENDOSCOPY;  Service: Gastroenterology;  Laterality: N/A;   ESOPHAGOGASTRODUODENOSCOPY N/A 06/15/2022   Procedure: ESOPHAGOGASTRODUODENOSCOPY (EGD);  Surgeon: Jaynie Collins, DO;  Location: [facility] ENDOSCOPY;  Service: Gastroenterology;  Laterality: N/A;   THYROIDECTOMY Right    partial    OB History     Gravida  3   Para  3   Term      Preterm      AB      Living         SAB      IAB      Ectopic  Multiple      Live Births               Home Medications    Prior to Admission medications   Medication Sig Start Date End Date Taking? Authorizing Provider  acetaminophen (TYLENOL) 500 MG tablet Take by mouth.    [provider]  albuterol (PROVENTIL HFA;VENTOLIN HFA) 108 (90 BASE) MCG/ACT inhaler Inhale into the lungs every 6 (six) hours as needed for wheezing or shortness of breath.    [provider]  diclofenac Sodium (VOLTAREN) 1 % GEL Apply 1/2 to 1 inch strip to hand and massage thoroughly TID 12/27/18   [provider]  dicyclomine (BENTYL) 20 MG tablet Take 1 tablet (20 mg total) by mouth daily for 10 days. 09/13/21 09/23/21  White, Elita Boone, NP  diphenhydramine-acetaminophen (TYLENOL PM EXTRA  STRENGTH) 25-500 MG TABS tablet Take 2 tablets by mouth at bedtime as needed.    [provider]  fluticasone (FLONASE) 50 MCG/ACT nasal spray Place into the nose.    [provider]  furosemide (LASIX) 20 MG tablet Take by mouth as needed. 09/25/19   [provider]  gabapentin (NEURONTIN) 300 MG capsule Take 300 mg by mouth daily. 06/22/20   [provider]  glucose blood (PRECISION QID TEST) test strip Use 3 (three) times daily Use as instructed. 11/14/19   [provider]  JARDIANCE 10 MG TABS tablet Take 10 mg by mouth daily. 10/01/20   [provider]  levothyroxine (SYNTHROID, LEVOTHROID) 75 MCG tablet Take 75 mcg by mouth daily before breakfast.    [provider]  montelukast (SINGULAIR) 10 MG tablet Take 10 mg by mouth at bedtime. 05/21/21   [provider]  omeprazole (PRILOSEC) 20 MG capsule Take 10 mg by mouth daily.    [provider]  ondansetron (ZOFRAN-ODT) 4 MG disintegrating tablet Take 1 tablet (4 mg total) by mouth every 8 (eight) hours as needed for nausea or vomiting. 09/13/21   Valinda Hoar, NP  phentermine (ADIPEX-P) 37.5 MG tablet Take 37.5 mg by mouth every morning. 09/16/20   [provider]  SYMBICORT 160-4.5 MCG/ACT inhaler SMARTSIG:2 Puff(s) By Mouth Twice Daily 07/04/20   [provider]  telmisartan (MICARDIS) 80 MG tablet Take 80 mg by mouth daily. 05/20/21   [provider]    Family History Family History  Problem Relation Age of Onset   Alzheimer's disease Mother    Bone cancer Father     Social History Social History   Tobacco Use   Smoking status: Every Day    Packs/day: 1.00    Years: 30.00    Additional pack years: 0.00    Total pack years: 30.00    Types: Cigarettes   Smokeless tobacco: Never  Vaping Use   Vaping Use: Never used  Substance Use Topics   Alcohol use: Yes    Comment: occasionally   Drug use: No     Allergies   Codeine  and Amlodipine   Review of Systems Review of Systems  Constitutional:  Negative for fever.  Respiratory:  Negative for shortness of breath.   Cardiovascular:  Positive for leg swelling. Negative for chest pain.  Musculoskeletal:  Positive for arthralgias, gait problem and joint swelling.  Skin:  Negative for color change, rash and wound.  Neurological:  Negative for dizziness and numbness.     Physical Exam Triage Vital Signs ED Triage Vitals  Enc Vitals Group     BP  07/09/21 1102 (!) 180/95     Pulse Rate 07/09/21 1102 85     Resp 07/09/21 1102 20     Temp 07/09/21 1102 98.2 F (36.8 C)     Temp Source 07/09/21 1102 Oral     SpO2 07/09/21 1102 99 %     Weight 07/09/21 1058 208 lb (94.3 kg)     Height 07/09/21 1058 5' 1.25" (1.556 m)     Head Circumference --      Peak Flow --      Pain Score 07/09/21 1055 9     Pain Loc --      Pain Edu? --      Excl. in GC? --    No data found.  Updated Vital Signs BP 108/62   Pulse 89   Temp 98.4 F (36.9 C) (Oral)   Resp 15   SpO2 98%       Physical Exam Vitals and nursing note reviewed.  Constitutional:      General: She is not in acute distress.    Appearance: Normal appearance. She is not ill-appearing or toxic-appearing.  HENT:     Head: Normocephalic and atraumatic.  Eyes:     General: No scleral icterus.       Right eye: No discharge.        Left eye: No discharge.     Conjunctiva/sclera: Conjunctivae normal.  Cardiovascular:     Rate and Rhythm: Normal rate and regular rhythm.     Heart sounds: Normal heart sounds.  Pulmonary:     Effort: Pulmonary effort is normal. No respiratory distress.     Breath sounds: Normal breath sounds.  Musculoskeletal:     Cervical back: Neck supple.     Left knee: Swelling present. No deformity. Decreased range of motion.     Left lower leg: Swelling and tenderness (diffuse TTP calf. TTP along mid tibia as well.) present. No deformity.     Left ankle: Swelling present. No  tenderness. Decreased range of motion.     Left Achilles Tendon: Tenderness present.  Skin:    General: Skin is dry.  Neurological:     General: No focal deficit present.     Mental Status: She is alert. Mental status is at baseline.     Motor: No weakness.     Coordination: Coordination normal.     Gait: Gait abnormal (Not weightbearing. In wheelchair.).  Psychiatric:        Mood and Affect: Mood normal.        Behavior: Behavior normal.        Thought Content: Thought content normal.      UC Treatments / Results  Labs (all labs ordered are listed, but only abnormal results are displayed) Labs Reviewed - No data to display  EKG   Radiology Procedures Procedures (including critical care time)  Medications Ordered in UC Medications  ketorolac (TORADOL) 30 MG/ML injection 30 mg (30 mg Intramuscular Given 09/02/22 1115)    Initial Impression / Assessment and Plan / UC Course  I have reviewed the triage vital signs and the nursing notes.  Pertinent labs & imaging results that were available during my care of the patient were reviewed by me and considered in my medical decision making (see chart for details).  60 female presents for left leg pain, swelling difficulty weightbearing after a fall today.  She reports stepping up to a curb, slipping and trying to catch herself.  Reports hopping on her leg  and then falling.  Now she reports she cannot apply weight without severe pain.  Reports intermittent sharp pains in her calf with calf swelling.  Increased pain with extension at knee and any movement of her foot.  On exam she has diffuse swelling of her calf and increased firmness.  Additionally has swelling of her ankle and knee.  Reduced range of motion of her knee and ankle due to pain and discomfort.  Advised patient she could have had a muscle rupture.  She is difficult to evaluate as she has significant pain with any movement.  She is in a wheelchair currently.  Afraid to bear  weight due to discomfort and weakness.  Advised her I cannot rule out a muscle rupture or tear at this time.  Advised her that we could perform x-rays to assess for possible underlying fracture, apply a boot, help control her pain and have her follow-up with orthopedics.  Initially patient is on board with this.  However, she changes her mind and declines to have the x-rays performed.  Patient states she would rather go to the emergency department at this time for further evaluation so she can receive an exact diagnosis.  She would like the ketorolac injection before leaving.  She was given 30 mg IM ketorolac in clinic for acute pain relief.  Patient plans to follow-up with [facility].  Leaving in stable condition.   Final Clinical Impressions(s) / UC Diagnoses   Final diagnoses:  Left leg pain  Left leg swelling     Discharge Instructions      -You are having severe pain and there is concern for possible calf muscle tear/rupture. You have decided to go to the ER.  -We gave you 30 mg IM ketorolac in clinic for pain relief.        ED Prescriptions   None    PDMP not reviewed this encounter.     Shirlee Latch, PA-C 09/02/22 1706

## 2022-09-02 NOTE — ED Notes (Signed)
Pt declined xray

## 2022-09-02 NOTE — ED Triage Notes (Addendum)
Pt presents with c/o lt leg pain. Pt states she tripped and her leg tightened up. Reports she has pain when moving her leg and cannot get it comfortable. Has walked around to see if the pain went away and states her leg still feels tight, hard and swollen.

## 2022-09-02 NOTE — ED Provider Triage Note (Signed)
Emergency Medicine Provider Triage Evaluation Note  Diana Dillon , a 67 y.o. female  was evaluated in triage.  Pt complains of left leg pain after near fall, cramps and spasms in lower leg.  Review of Systems  Positive:  Negative:   Physical Exam  BP 97/81   Pulse 84   Temp 98.1 F (36.7 C)   Resp 20   Ht 5\' 1"  (1.549 m)   Wt 97.5 kg   SpO2 95%   BMI 40.62 kg/m  Gen:   Awake, no distress   Resp:  Normal effort  MSK:   Moves extremities without difficulty  Other:    Medical Decision Making  Medically screening exam initiated at 11:54 AM.  Appropriate orders placed.  Diana Dillon was informed that the remainder of the evaluation will be completed by another provider, this initial triage assessment does not replace that evaluation, and the importance of remaining in the ED until their evaluation is complete.     Faythe Ghee, PA-C 09/02/22 1156

## 2022-09-24 ENCOUNTER — Other Ambulatory Visit: Payer: Self-pay | Admitting: Nephrology

## 2022-09-24 DIAGNOSIS — N1832 Chronic kidney disease, stage 3b: Secondary | ICD-10-CM

## 2022-09-24 DIAGNOSIS — N281 Cyst of kidney, acquired: Secondary | ICD-10-CM

## 2022-09-27 IMAGING — CR DG ANKLE COMPLETE 3+V*L*
3 series · 3 of 3 positions shown · non-contrast
Comparison: None available

CLINICAL DATA: Fall 2-3 weeks ago with interval development of
ankle pain.

EXAM:
LEFT ANKLE COMPLETE - 3+ VIEW

[ankle ap]
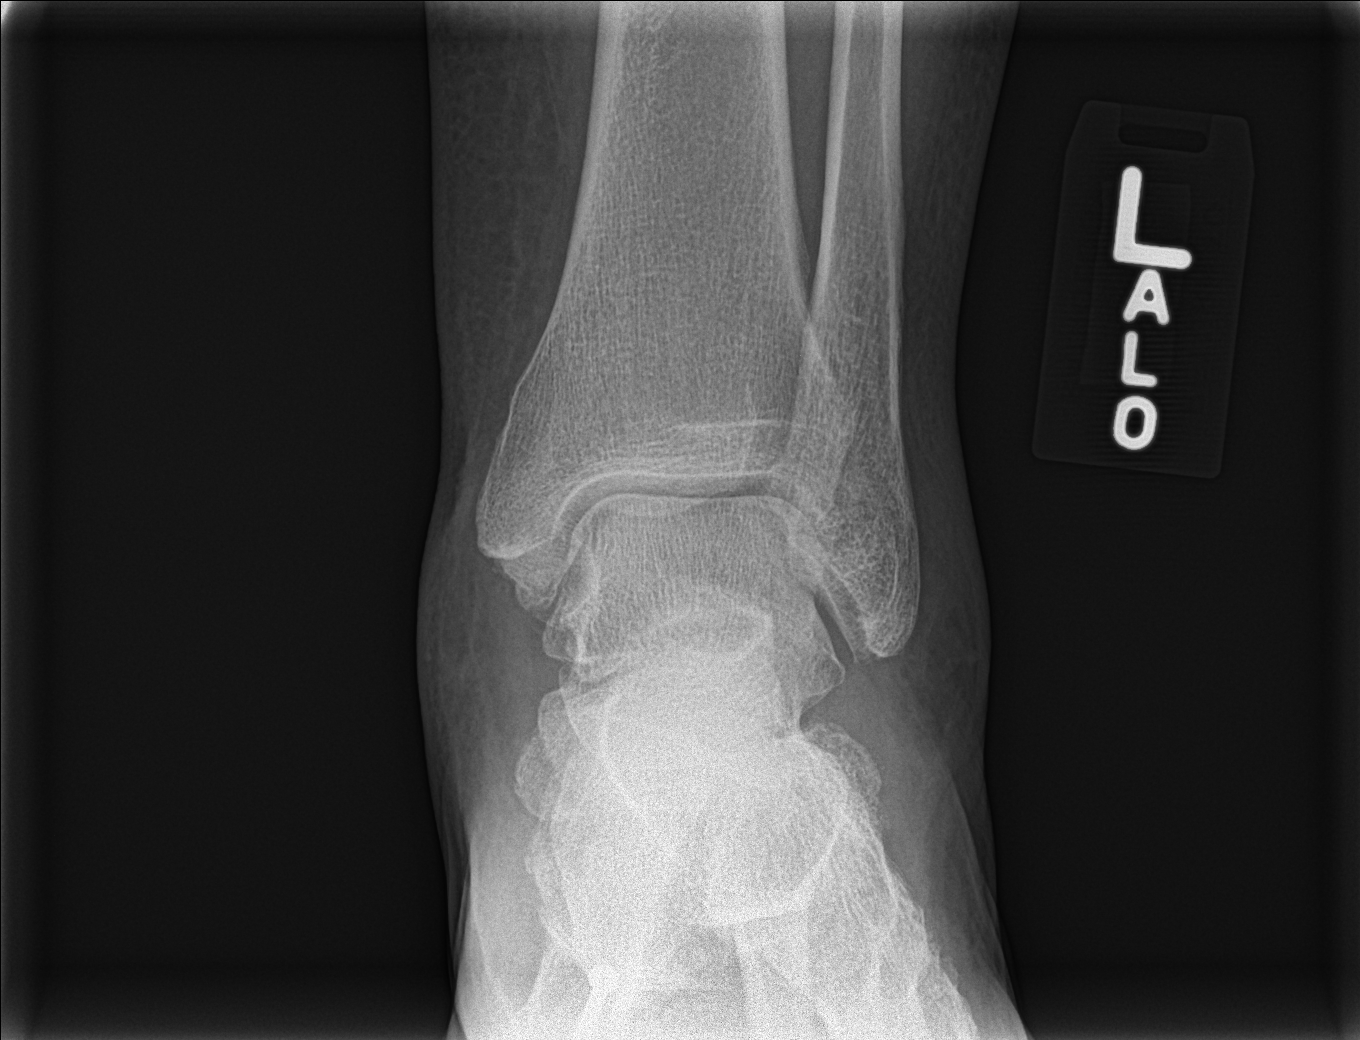

[ankle obl]
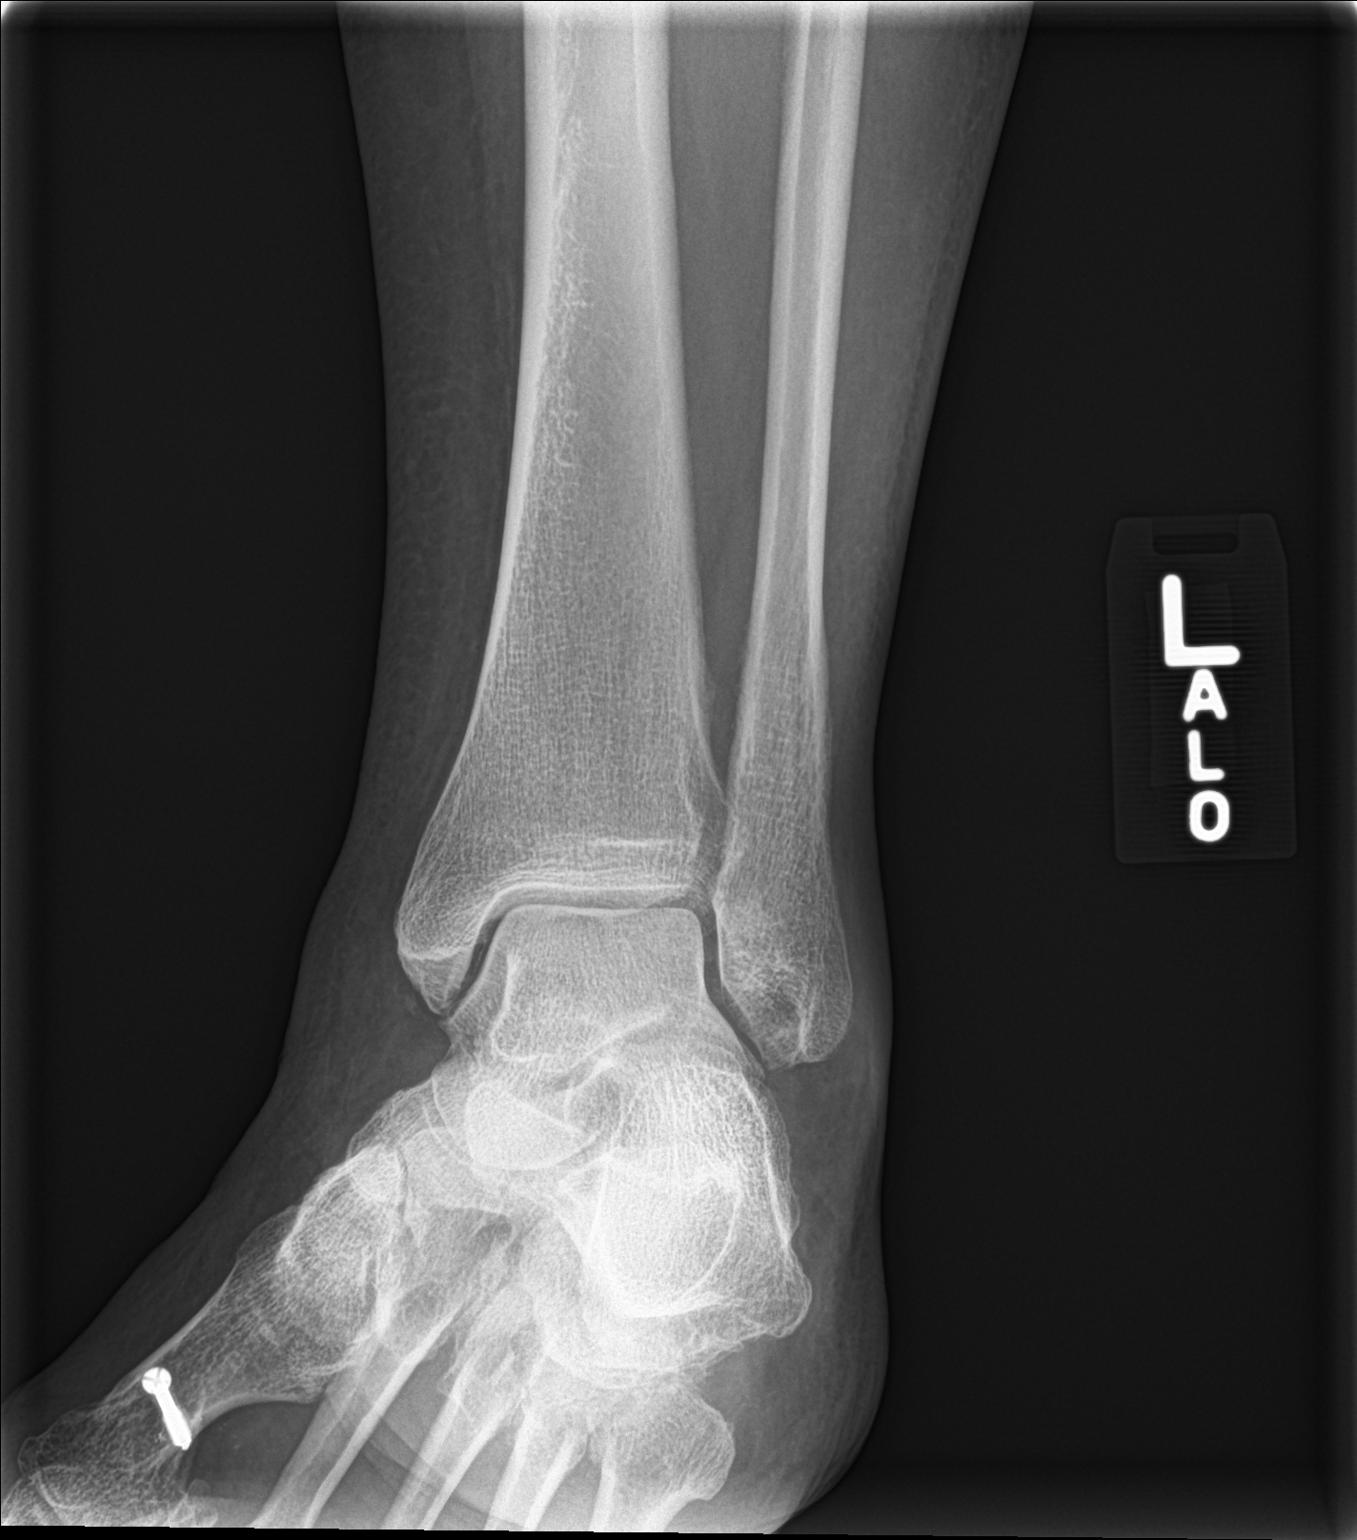

[ankle lat]
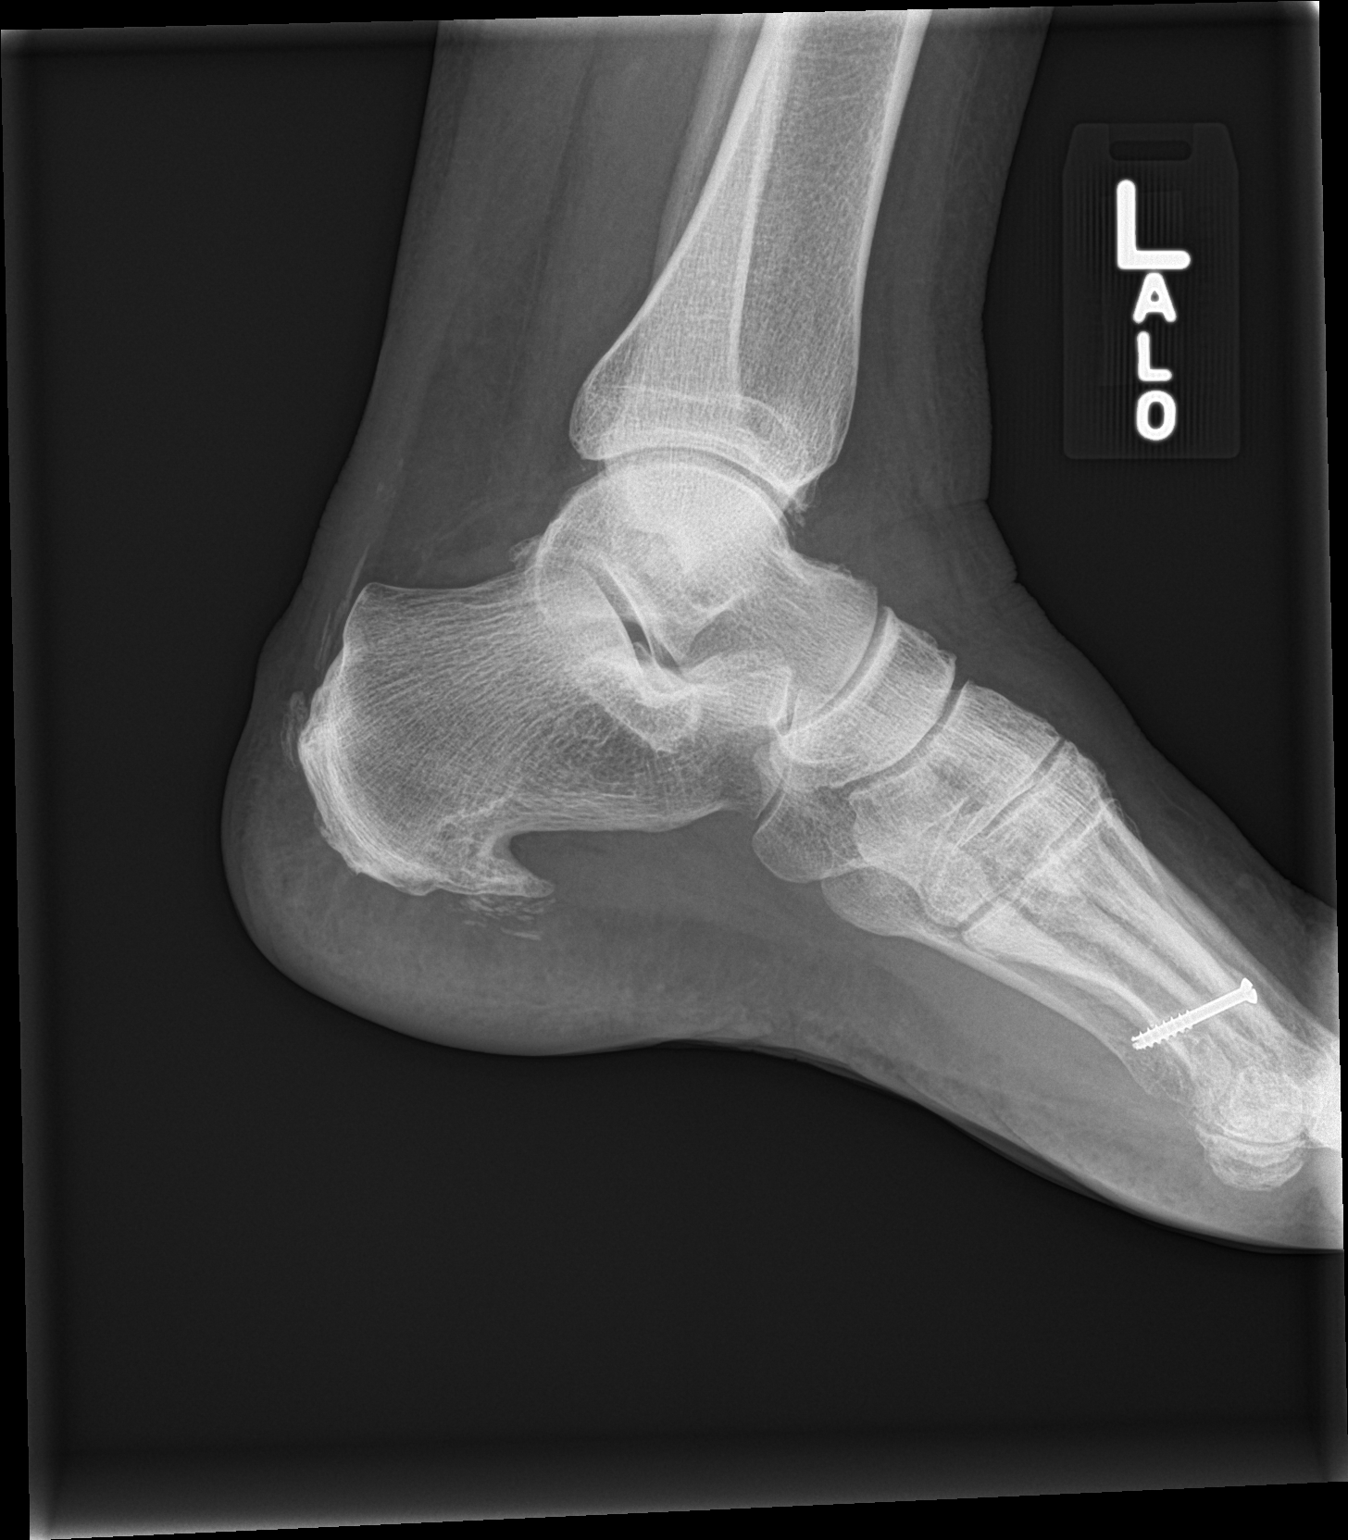

[3 of 3 positions shown; findings below may reference images not displayed]

FINDINGS: Ankle mortise is preserved. No sign of acute fracture or
dislocation. Soft tissue swelling about the medial and lateral
malleolus. Calcaneal enthesopathy and moderate spurring at the
plantar and Achilles insertion site.

Signs of first MTP osteotomy.

Degenerative changes about the ankle and midfoot.
IMPRESSION: 1. Soft tissue swelling without acute osseous abnormality.

## 2022-09-29 ENCOUNTER — Ambulatory Visit
Admission: RE | Admit: 2022-09-29 | Discharge: 2022-09-29 | Disposition: A | Discharge status: Home / Self Care | Payer: Medicare HMO | Source: Ambulatory Visit | Attending: Cardiology | Admitting: Cardiology

## 2022-09-29 DIAGNOSIS — N281 Cyst of kidney, acquired: Secondary | ICD-10-CM | POA: Diagnosis present

## 2022-09-29 DIAGNOSIS — N1832 Chronic kidney disease, stage 3b: Secondary | ICD-10-CM | POA: Insufficient documentation

## 2022-09-30 ENCOUNTER — Other Ambulatory Visit: Payer: Medicare HMO

## 2022-10-05 ENCOUNTER — Other Ambulatory Visit: Payer: Self-pay | Admitting: Nephrology

## 2022-10-05 DIAGNOSIS — N1832 Chronic kidney disease, stage 3b: Secondary | ICD-10-CM

## 2022-10-05 DIAGNOSIS — N281 Cyst of kidney, acquired: Secondary | ICD-10-CM

## 2023-04-20 ENCOUNTER — Encounter: Payer: Self-pay | Admitting: Gastroenterology

## 2023-04-21 ENCOUNTER — Ambulatory Visit
Admission: EM | Admit: 2023-04-21 | Discharge: 2023-04-21 | Disposition: A | Discharge status: Home / Self Care | Payer: Medicare HMO

## 2023-04-21 DIAGNOSIS — R42 Dizziness and giddiness: Secondary | ICD-10-CM

## 2023-04-21 MED ORDER — MECLIZINE HCL 25 MG PO TABS: 25.0000 mg | ORAL_TABLET | Freq: Three times a day (TID) | ORAL | 0 refills | Status: DC | PRN

## 2023-04-21 NOTE — Discharge Instructions (Signed)
Take the meclizine 25 mg every 8 hours as needed for dizziness.  Be mindful of this medication will make you sleepy so you do not drink alcohol or drive if you take it.  Avoid any sudden movements that might intensify your dizziness.  If you are feeling unsteady on your feet you may would consider using a cane to help with stability.  Remove any tripping hazards from your home.  Use nonslip mats in the bath and shower.  Avoid caffeine, alcohol, salt, tobacco as these may make your symptoms worse.  Make sure that you are drinking plenty of fluids to maintain hydration.  Make sure that you are getting plenty of sleep.  Sit or lie down if you feel dizzy.  If you develop any severe headache, changes in vision, numbness or weakness in any of your extremities, or nausea and vomiting where you cannot keep down medications you need to go to the ER for evaluation.

## 2023-04-21 NOTE — ED Provider Notes (Signed)
[facility]    CSN: [id number] Arrival date & time: 04/21/23  1650      History   Chief Complaint Chief Complaint  Patient presents with   Dizziness    HPI Diana Dillon is a 68 y.o. female.   HPI  67 year old female with past medical history significant for hypertension, thyroid cancer, uterine cancer, GERD, asthma, CHF, chronic kidney disease presents for evaluation of dizziness that started when she woke up this morning.  She states that she experiences off and on throughout the day but when she was driving home from work today she looked to the left in traffic and then turned her head to the right and her symptoms became more constant.  This is associated with some intermittent blurry vision and nausea.  She denies any headache, vomiting, or weakness.  She does have a history of vertigo and states that her symptoms feel similar to her previous vertigo attacks.  Past Medical History:  Diagnosis Date   Asthma    CHF (congestive heart failure) (HCC)    Chronic kidney disease    Dyspnea    GERD (gastroesophageal reflux disease)    Hypertension    Hypothyroidism    Thyroid cancer (HCC)    in remission   Uterine cancer (HCC)    in remission    Patient Active Problem List   Diagnosis Date Noted   Numbness and tingling in right hand 05/10/2021   Chronic kidney disease, stage 3a (HCC) 09/11/2020   Heart failure, unspecified (HCC) 09/11/2020   Chronic heart failure with preserved ejection fraction (HCC) 12/24/2019   Obesity, Class III, BMI 40-49.9 (morbid obesity) (HCC) 11/14/2019   Dysuria 10/14/2018   Vitamin D deficiency 11/02/2017   Prediabetes 01/23/2016   H/O thyroidectomy 12/10/2014   History of uterine cancer 12/10/2014   H/O: hysterectomy 12/10/2014   H/O: C-section 12/10/2014   Asthma 12/10/2014   Moderate persistent asthma without complication 12/10/2014   Thyroid nodule 03/10/2012   Hypercholesterolemia 02/29/2012   Essential (primary)  hypertension 01/27/2012   GERD (gastroesophageal reflux disease) 01/27/2012   Tobacco use 01/27/2012    Past Surgical History:  Procedure Laterality Date   ABDOMINAL HYSTERECTOMY     CARPAL TUNNEL RELEASE Right 08/06/2021   Procedure: CARPAL TUNNEL RELEASE ENDOSCOPIC;  Surgeon: Christena Flake, MD;  Location: [facility] ORS;  Service: Orthopedics;  Laterality: Right;   CESAREAN SECTION     x 2   COLONOSCOPY N/A 06/15/2022   Procedure: COLONOSCOPY;  Surgeon: Jaynie Collins, DO;  Location: [facility] ENDOSCOPY;  Service: Gastroenterology;  Laterality: N/A;   ESOPHAGOGASTRODUODENOSCOPY N/A 06/15/2022   Procedure: ESOPHAGOGASTRODUODENOSCOPY (EGD);  Surgeon: Jaynie Collins, DO;  Location: [facility] ENDOSCOPY;  Service: Gastroenterology;  Laterality: N/A;   THYROIDECTOMY Right    partial    OB History     Gravida  3   Para  3   Term      Preterm      AB      Living         SAB      IAB      Ectopic      Multiple      Live Births               Home Medications    Prior to Admission medications   Medication Sig Start Date End Date Taking? Authorizing Provider  albuterol (PROVENTIL HFA;VENTOLIN HFA) 108 (90 BASE) MCG/ACT inhaler Inhale into the lungs every 6 (six)  hours as needed for wheezing or shortness of breath.   Yes [provider]  diphenhydramine-acetaminophen (TYLENOL PM EXTRA STRENGTH) 25-500 MG TABS tablet Take 2 tablets by mouth at bedtime as needed.   Yes [provider]  fluticasone (FLONASE) 50 MCG/ACT nasal spray Place into the nose.   Yes [provider]  gabapentin (NEURONTIN) 300 MG capsule Take 300 mg by mouth daily. 06/22/20  Yes [provider]  JARDIANCE 10 MG TABS tablet Take 10 mg by mouth daily. 10/01/20  Yes [provider]  levothyroxine (SYNTHROID, LEVOTHROID) 75 MCG tablet Take 75 mcg by mouth daily before breakfast.   Yes [provider]  meclizine (ANTIVERT) 25 MG tablet Take 1 tablet  (25 mg total) by mouth 3 (three) times daily as needed for dizziness. 04/21/23  Yes Becky Augusta, NP  montelukast (SINGULAIR) 10 MG tablet Take 10 mg by mouth at bedtime. 05/21/21  Yes [provider]  omeprazole (PRILOSEC) 20 MG capsule Take 10 mg by mouth daily.   Yes [provider]  Semaglutide-Weight Management 2.4 MG/0.75ML SOAJ Inject into the skin. 02/09/23  Yes [provider]  SYMBICORT 160-4.5 MCG/ACT inhaler SMARTSIG:2 Puff(s) By Mouth Twice Daily 07/04/20  Yes [provider]  telmisartan (MICARDIS) 80 MG tablet Take 80 mg by mouth daily. 05/20/21  Yes [provider]  acetaminophen (TYLENOL) 500 MG tablet Take by mouth.    [provider]  diclofenac Sodium (VOLTAREN) 1 % GEL Apply 1/2 to 1 inch strip to hand and massage thoroughly TID 12/27/18   [provider]  dicyclomine (BENTYL) 20 MG tablet Take 1 tablet (20 mg total) by mouth daily for 10 days. 09/13/21 09/23/21  Valinda Hoar, NP  furosemide (LASIX) 20 MG tablet Take by mouth as needed. 09/25/19   [provider]  glucose blood (PRECISION QID TEST) test strip Use 3 (three) times daily Use as instructed. 11/14/19   [provider]  ondansetron (ZOFRAN-ODT) 4 MG disintegrating tablet Take 1 tablet (4 mg total) by mouth every 8 (eight) hours as needed for nausea or vomiting. 09/13/21   Valinda Hoar, NP  phentermine (ADIPEX-P) 37.5 MG tablet Take 37.5 mg by mouth every morning. 09/16/20   [provider]    Family History Family History  Problem Relation Age of Onset   Alzheimer's disease Mother    Bone cancer Father     Social History Social History   Tobacco Use   Smoking status: Every Day    Current packs/day: 1.00    Average packs/day: 1 pack/day for 30.0 years (30.0 ttl pk-yrs)    Types: Cigarettes   Smokeless tobacco: Never  Vaping Use   Vaping status: Never Used  Substance Use Topics   Alcohol use: Yes    Comment:  occasionally   Drug use: No     Allergies   Codeine, Amlodipine, Atorvastatin, and Rosuvastatin   Review of Systems Review of Systems  Gastrointestinal:  Positive for nausea. Negative for vomiting.  Neurological:  Positive for dizziness. Negative for weakness, numbness and headaches.     Physical Exam Triage Vital Signs ED Triage Vitals [04/21/23 1710]  Encounter Vitals Group     BP 124/79     Systolic BP Percentile      Diastolic BP Percentile      Pulse Rate 76     Resp 16     Temp 98.5 F (36.9 C)     Temp Source Oral  SpO2 98 %     Weight      Height      Head Circumference      Peak Flow      Pain Score      Pain Loc      Pain Education      Exclude from Growth Chart    No data found.  Updated Vital Signs BP 124/79 (BP Location: Left Arm)   Pulse 76   Temp 98.5 F (36.9 C) (Oral)   Resp 16   SpO2 98%   Visual Acuity Right Eye Distance:   Left Eye Distance:   Bilateral Distance:    Right Eye Near:   Left Eye Near:    Bilateral Near:     Physical Exam Vitals and nursing note reviewed.  Constitutional:      Appearance: Normal appearance. She is not ill-appearing.  HENT:     Head: Normocephalic and atraumatic.     Right Ear: Tympanic membrane, ear canal and external ear normal. There is no impacted cerumen.     Left Ear: Tympanic membrane, ear canal and external ear normal. There is no impacted cerumen.  Eyes:     General: No scleral icterus.       Right eye: No discharge.        Left eye: No discharge.     Extraocular Movements: Extraocular movements intact.     Pupils: Pupils are equal, round, and reactive to light.  Skin:    General: Skin is warm and dry.     Capillary Refill: Capillary refill takes less than 2 seconds.  Neurological:     General: No focal deficit present.     Mental Status: She is alert and oriented to person, place, and time.     Cranial Nerves: No cranial nerve deficit.     Sensory: No sensory deficit.      Motor: No weakness.     Coordination: Coordination normal.     Gait: Gait normal.     Deep Tendon Reflexes: Reflexes normal.      UC Treatments / Results  Labs (all labs ordered are listed, but only abnormal results are displayed) Labs Reviewed - No data to display  EKG   Radiology No results found.  Procedures Procedures (including critical care time)  Medications Ordered in UC Medications - No data to display  Initial Impression / Assessment and Plan / UC Course  I have reviewed the triage vital signs and the nursing notes.  Pertinent labs & imaging results that were available during my care of the patient were reviewed by me and considered in my medical decision making (see chart for details).   Patient is a pleasant, nontoxic appearing 68 year old female with a past medical history significant for vertigo presenting for vertigo-like symptoms that started this morning when she woke up.  They had been off and on all day but then on her way home from work they became more constant.  The patient is fine when she is looking straight ahead and only experiences dizziness when she looks down.  Physical exam reveals cranial nerves II through XII are intact.  Pupils are equal round reactive and EOM is intact.  Bilateral grips, upper extremity strength, and lower extremity strength are all 5/5.  DTRs are 2+ globally.  Patient has normal finger-to-nose and is negative for pronator drift.  Otoscopic exam is benign.  Patient exam is most consistent with benign positional vertigo.  She has used meclizine in  the past to help with her symptoms.  I will discharge her home with a diagnosis of vertigo on meclizine 25 mg every 8 hours as needed for dizziness and nausea.  I have advised the patient that if she has any worsening of her symptoms, develops sharp headache, develops changes in vision, or forceful vomiting not controlled by the meclizine that she needs to call 911 and go to the ER.   Final  Clinical Impressions(s) / UC Diagnoses   Final diagnoses:  Vertigo     Discharge Instructions      Take the meclizine 25 mg every 8 hours as needed for dizziness.  Be mindful of this medication will make you sleepy so you do not drink alcohol or drive if you take it.  Avoid any sudden movements that might intensify your dizziness.  If you are feeling unsteady on your feet you may would consider using a cane to help with stability.  Remove any tripping hazards from your home.  Use nonslip mats in the bath and shower.  Avoid caffeine, alcohol, salt, tobacco as these may make your symptoms worse.  Make sure that you are drinking plenty of fluids to maintain hydration.  Make sure that you are getting plenty of sleep.  Sit or lie down if you feel dizzy.  If you develop any severe headache, changes in vision, numbness or weakness in any of your extremities, or nausea and vomiting where you cannot keep down medications you need to go to the ER for evaluation.      ED Prescriptions     Medication Sig Dispense Auth. Provider   meclizine (ANTIVERT) 25 MG tablet Take 1 tablet (25 mg total) by mouth 3 (three) times daily as needed for dizziness. 30 tablet Becky Augusta, NP      PDMP not reviewed this encounter.   Becky Augusta, NP 04/21/23 1733

## 2023-04-21 NOTE — ED Triage Notes (Signed)
Pt states when she woke up she had dizziness that comes and goes. Pt states it is worse when looking down or turning her head. Pt states she has a history of vertigo.

## 2023-04-22 ENCOUNTER — Encounter: Payer: Self-pay | Admitting: Gastroenterology

## 2023-04-30 ENCOUNTER — Encounter: Payer: Self-pay | Admitting: Gastroenterology

## 2023-04-30 ENCOUNTER — Ambulatory Visit: Payer: Medicare HMO | Admitting: General Practice

## 2023-04-30 ENCOUNTER — Encounter
Admission: RE | Disposition: A | Discharge status: Home / Self Care | Payer: Self-pay | Source: Home / Self Care | Attending: Gastroenterology

## 2023-04-30 ENCOUNTER — Ambulatory Visit
Admission: RE | Admit: 2023-04-30 | Discharge: 2023-04-30 | Disposition: A | Discharge status: Home / Self Care | Payer: Medicare HMO | Attending: Gastroenterology | Admitting: Gastroenterology

## 2023-04-30 DIAGNOSIS — E039 Hypothyroidism, unspecified: Secondary | ICD-10-CM | POA: Insufficient documentation

## 2023-04-30 DIAGNOSIS — Z79890 Hormone replacement therapy (postmenopausal): Secondary | ICD-10-CM | POA: Diagnosis not present

## 2023-04-30 DIAGNOSIS — Z6837 Body mass index (BMI) 37.0-37.9, adult: Secondary | ICD-10-CM | POA: Diagnosis not present

## 2023-04-30 DIAGNOSIS — I509 Heart failure, unspecified: Secondary | ICD-10-CM | POA: Insufficient documentation

## 2023-04-30 DIAGNOSIS — Z7951 Long term (current) use of inhaled steroids: Secondary | ICD-10-CM | POA: Diagnosis not present

## 2023-04-30 DIAGNOSIS — N189 Chronic kidney disease, unspecified: Secondary | ICD-10-CM | POA: Insufficient documentation

## 2023-04-30 DIAGNOSIS — K640 First degree hemorrhoids: Secondary | ICD-10-CM | POA: Diagnosis not present

## 2023-04-30 DIAGNOSIS — J45909 Unspecified asthma, uncomplicated: Secondary | ICD-10-CM | POA: Diagnosis not present

## 2023-04-30 DIAGNOSIS — E1122 Type 2 diabetes mellitus with diabetic chronic kidney disease: Secondary | ICD-10-CM | POA: Insufficient documentation

## 2023-04-30 DIAGNOSIS — Z7984 Long term (current) use of oral hypoglycemic drugs: Secondary | ICD-10-CM | POA: Diagnosis not present

## 2023-04-30 DIAGNOSIS — K2970 Gastritis, unspecified, without bleeding: Secondary | ICD-10-CM | POA: Insufficient documentation

## 2023-04-30 DIAGNOSIS — I130 Hypertensive heart and chronic kidney disease with heart failure and stage 1 through stage 4 chronic kidney disease, or unspecified chronic kidney disease: Secondary | ICD-10-CM | POA: Insufficient documentation

## 2023-04-30 DIAGNOSIS — D125 Benign neoplasm of sigmoid colon: Secondary | ICD-10-CM | POA: Insufficient documentation

## 2023-04-30 DIAGNOSIS — K219 Gastro-esophageal reflux disease without esophagitis: Secondary | ICD-10-CM | POA: Insufficient documentation

## 2023-04-30 DIAGNOSIS — K317 Polyp of stomach and duodenum: Secondary | ICD-10-CM | POA: Diagnosis not present

## 2023-04-30 DIAGNOSIS — D123 Benign neoplasm of transverse colon: Secondary | ICD-10-CM | POA: Diagnosis not present

## 2023-04-30 DIAGNOSIS — K2289 Other specified disease of esophagus: Secondary | ICD-10-CM | POA: Diagnosis not present

## 2023-04-30 DIAGNOSIS — E66813 Obesity, class 3: Secondary | ICD-10-CM | POA: Insufficient documentation

## 2023-04-30 DIAGNOSIS — Z79899 Other long term (current) drug therapy: Secondary | ICD-10-CM | POA: Diagnosis not present

## 2023-04-30 DIAGNOSIS — F17210 Nicotine dependence, cigarettes, uncomplicated: Secondary | ICD-10-CM | POA: Insufficient documentation

## 2023-04-30 DIAGNOSIS — Z1211 Encounter for screening for malignant neoplasm of colon: Secondary | ICD-10-CM | POA: Insufficient documentation

## 2023-04-30 DIAGNOSIS — K31A11 Gastric intestinal metaplasia without dysplasia, involving the antrum: Secondary | ICD-10-CM | POA: Diagnosis not present

## 2023-04-30 HISTORY — PX: BIOPSY: SHX5522

## 2023-04-30 HISTORY — DX: Cyst of kidney, acquired: N28.1

## 2023-04-30 HISTORY — DX: Type 2 diabetes mellitus without complications: E11.9

## 2023-04-30 HISTORY — PX: SUBMUCOSAL TATTOO INJECTION: SHX6856

## 2023-04-30 HISTORY — DX: Nontoxic goiter, unspecified: E04.9

## 2023-04-30 HISTORY — DX: Chronic kidney disease, unspecified: N18.9

## 2023-04-30 HISTORY — PX: COLONOSCOPY WITH PROPOFOL: SHX5780

## 2023-04-30 HISTORY — DX: Zoster without complications: B02.9

## 2023-04-30 HISTORY — PX: ESOPHAGOGASTRODUODENOSCOPY (EGD) WITH PROPOFOL: SHX5813

## 2023-04-30 LAB — GLUCOSE, CAPILLARY: Glucose-Capillary: 105 mg/dL — ABNORMAL HIGH (ref 70–99)

## 2023-04-30 SURGERY — COLONOSCOPY WITH PROPOFOL
Anesthesia: General

## 2023-04-30 MED ORDER — PROPOFOL 10 MG/ML IV BOLUS
INTRAVENOUS | Status: DC | PRN
  Administered 2023-04-30 (×2): 20 mg via INTRAVENOUS
  Administered 2023-04-30: 60 mg via INTRAVENOUS

## 2023-04-30 MED ORDER — PROPOFOL 1000 MG/100ML IV EMUL
INTRAVENOUS | Status: AC
  Filled 2023-04-30: qty 400

## 2023-04-30 MED ORDER — PROPOFOL 500 MG/50ML IV EMUL
INTRAVENOUS | Status: DC | PRN
  Administered 2023-04-30: 145 ug/kg/min via INTRAVENOUS

## 2023-04-30 MED ORDER — LIDOCAINE HCL (CARDIAC) PF 100 MG/5ML IV SOSY
PREFILLED_SYRINGE | INTRAVENOUS | Status: DC | PRN
  Administered 2023-04-30: 100 mg via INTRAVENOUS

## 2023-04-30 MED ORDER — SODIUM CHLORIDE 0.9 % IV SOLN
INTRAVENOUS | Status: DC
  Administered 2023-04-30: 20 mL/h via INTRAVENOUS

## 2023-04-30 NOTE — Anesthesia Procedure Notes (Signed)
 Procedure Name: General with mask airway Date/Time: 04/30/2023 7:35 AM  Performed by: Mohammed Kindle, CRNAPre-anesthesia Checklist: Patient identified, Emergency Drugs available, Suction available and Patient being monitored Patient Re-evaluated:Patient Re-evaluated prior to induction Oxygen Delivery Method: Simple face mask Induction Type: IV induction Placement Confirmation: positive ETCO2 and breath sounds checked- equal and bilateral Dental Injury: Teeth and Oropharynx as per pre-operative assessment

## 2023-04-30 NOTE — Op Note (Signed)
 [facility]astroenterology Patient Name: Diana Dillon Procedure Date: 04/30/2023 7:16 AM MRN: [id number] Account #: [id number] Date of Birth: 11-20-1955 Admit Type: Outpatient Age: 68 Room: [facility] ENDO ROOM 1 Gender: Female Note Status: Finalized Instrument Name: Colonoscope [id number] Procedure:             Colonoscopy Indications:           High risk colon cancer surveillance: Personal history                         of colonic polyps Providers:             Trenda Moots, DO Referring MD:          [facility] Primary Medicines:             Monitored Anesthesia Care Complications:         No immediate complications. Estimated blood loss:                         Minimal. Procedure:             Pre-Anesthesia Assessment:                        - Prior to the procedure, a History and Physical was                         performed, and patient medications and allergies were                         reviewed. The patient is competent. The risks and                         benefits of the procedure and the sedation options and                         risks were discussed with the patient. All questions                         were answered and informed consent was obtained.                         Patient identification and proposed procedure were                         verified by the physician, the nurse, the anesthetist                         and the technician in the endoscopy suite. Mental                         Status Examination: alert and oriented. Airway                         Examination: normal oropharyngeal airway and neck                         mobility. Respiratory Examination: clear to  auscultation. CV Examination: RRR, no murmurs, no S3                         or S4. Prophylactic Antibiotics: The patient does not                         require prophylactic antibiotics. Prior                         Anticoagulants: The  patient has taken no anticoagulant                         or antiplatelet agents. ASA Grade Assessment: III - A                         patient with severe systemic disease. After reviewing                         the risks and benefits, the patient was deemed in                         satisfactory condition to undergo the procedure. The                         anesthesia plan was to use monitored anesthesia care                         (MAC). Immediately prior to administration of                         medications, the patient was re-assessed for adequacy                         to receive sedatives. The heart rate, respiratory                         rate, oxygen saturations, blood pressure, adequacy of                         pulmonary ventilation, and response to care were                         monitored throughout the procedure. The physical                         status of the patient was re-assessed after the                         procedure.                        After obtaining informed consent, the colonoscope was                         passed under direct vision. Throughout the procedure,                         the patient's blood pressure, pulse, and oxygen  saturations were monitored continuously. The                         Colonoscope was introduced through the anus and                         advanced to the the terminal ileum, with                         identification of the appendiceal orifice and IC                         valve. The colonoscopy was performed without                         difficulty. The patient tolerated the procedure well.                         The quality of the bowel preparation was evaluated                         using the BBPS [facility] Bowel Preparation Scale) with                         scores of: Right Colon = 3, Transverse Colon = 3 and                         Left Colon = 3 (entire mucosa seen well with  no                         residual staining, small fragments of stool or opaque                         liquid). The total BBPS score equals 9. The terminal                         ileum, ileocecal valve, appendiceal orifice, and                         rectum were photographed. Findings:      The perianal and digital rectal examinations were normal. Pertinent       negatives include normal sphincter tone.      The terminal ileum appeared normal. Estimated blood loss: none.      Retroflexion in the right colon was performed.      Five sessile polyps were found in the sigmoid colon and transverse       colon. The polyps were 1 to 2 mm in size. These polyps were removed with       a jumbo cold forceps. Resection and retrieval were complete. Estimated       blood loss was minimal.      A 16 to 18 mm polyp was found in the hepatic flexure. The polyp was       sessile. Polyp was in the 9oclock position between and laterally       spreading, crossing over fold and was difficult to visualize in       totality. Imaging was performed using white light and narrow band  imaging to visualize the mucosa. Polypectomy was not attempted due to       polyp size (too large to be excised). Area was tattooed with an       injection of 2 mL of [place] ink. Tattoo was placed on opposite wall just       distal to the polyp Estimated blood loss was minimal.      Non-bleeding internal hemorrhoids were found during retroflexion. The       hemorrhoids were Grade I (internal hemorrhoids that do not prolapse).       Estimated blood loss: none.      The exam was otherwise without abnormality on direct and retroflexion       views. Impression:            - The examined portion of the ileum was normal.                        - Five 1 to 2 mm polyps in the sigmoid colon and in                         the transverse colon, removed with a jumbo cold                         forceps. Resected and retrieved.                         - One 16 to 18 mm polyp at the hepatic flexure.                         Resection not attempted. Tattooed.                        - Non-bleeding internal hemorrhoids.                        - The examination was otherwise normal on direct and                         retroflexion views. Recommendation:        - Patient has a contact number available for                         emergencies. The signs and symptoms of potential                         delayed complications were discussed with the patient.                         Return to normal activities tomorrow. Written                         discharge instructions were provided to the patient.                        - Discharge patient to home.                        - Resume previous diet.                        -  Continue present medications.                        - Await pathology results.                        - Repeat colonoscopy at appointment to be scheduled                         for large polyp removal with [facility].                        - Refer to [facility] at appointment to be                         scheduled.                        - Return to referring physician as previously                         scheduled.                        - The findings and recommendations were discussed with                         the patient. Procedure Code(s):     --- Professional ---                        [phone], Colonoscopy, flexible; with biopsy, single or                         multiple                        [id number], Colonoscopy, flexible; with directed submucosal                         injection(s), any substance Diagnosis Code(s):     --- Professional ---                        Z86.010, Personal history of colonic polyps                        K64.0, First degree hemorrhoids                        D12.5, Benign neoplasm of sigmoid colon                        D12.3, Benign neoplasm of transverse  colon (hepatic                         flexure or splenic flexure) CPT copyright 2022 American Medical Association. All rights reserved. The codes documented in this report are preliminary and upon coder review may  be revised to meet current compliance requirements. Attending Participation:      I personally performed the entire procedure. Elfredia Nevins, DO Jaynie Collins DO, DO 04/30/2023 8:18:48 AM This report has been signed electronically. Number of Addenda: 0 Note Initiated  On: 04/30/2023 7:16 AM Scope Withdrawal Time: 0 hours 17 minutes 45 seconds  Total Procedure Duration: 0 hours 19 minutes 32 seconds  Estimated Blood Loss:  Estimated blood loss was minimal.      [facility]

## 2023-04-30 NOTE — Anesthesia Preprocedure Evaluation (Signed)
 Anesthesia Evaluation  Patient identified by MRN, date of birth, ID band Patient awake    Reviewed: Allergy & Precautions, NPO status , Patient's Chart, lab work & pertinent test results  History of Anesthesia Complications Negative for: history of anesthetic complications  Airway Mallampati: III  TM Distance: >3 FB Neck ROM: full    Dental no notable dental hx. (+) Dental Advidsory Given   Pulmonary shortness of breath and with exertion, asthma , Current Smoker and Patient abstained from smoking.   Pulmonary exam normal        Cardiovascular hypertension, On Medications +CHF  Normal cardiovascular exam     Neuro/Psych negative neurological ROS  negative psych ROS   GI/Hepatic Neg liver ROS,GERD  Medicated,,  Endo/Other  diabetesHypothyroidism  Class 3 obesity  Renal/GU      Musculoskeletal   Abdominal   Peds  Hematology negative hematology ROS (+)   Anesthesia Other Findings Past Medical History: No date: Asthma No date: CHF (congestive heart failure) No date: Dyspnea No date: GERD (gastroesophageal reflux disease) No date: Hypertension No date: Hypothyroidism No date: Thyroid cancer     Comment:  in remission No date: Uterine cancer     Comment:  in remission  Past Surgical History: No date: ABDOMINAL HYSTERECTOMY 08/06/2021: CARPAL TUNNEL RELEASE; Right     Comment:  Procedure: CARPAL TUNNEL RELEASE ENDOSCOPIC;  Surgeon:               Christena Flake, MD;  Location: [facility] ORS;  Service:               Orthopedics;  Laterality: Right; No date: CESAREAN SECTION     Comment:  x 2 No date: COLONOSCOPY No date: THYROIDECTOMY; Right     Comment:  partial  BMI    Body Mass Index: 39.09 kg/m      Reproductive/Obstetrics negative OB ROS                             Anesthesia Physical Anesthesia Plan  ASA: 3  Anesthesia Plan: General   Post-op Pain Management: Minimal or  no pain anticipated   Induction: Intravenous  PONV Risk Score and Plan: 3 and Propofol infusion, TIVA and Ondansetron  Airway Management Planned: Nasal Cannula  Additional Equipment: None  Intra-op Plan:   Post-operative Plan:   Informed Consent: I have reviewed the patients History and Physical, chart, labs and discussed the procedure including the risks, benefits and alternatives for the proposed anesthesia with the patient or authorized representative who has indicated his/her understanding and acceptance.     Dental advisory given  Plan Discussed with: CRNA and Surgeon  Anesthesia Plan Comments: (Discussed risks of anesthesia with patient, including possibility of difficulty with spontaneous ventilation under anesthesia necessitating airway intervention, PONV, and rare risks such as cardiac or respiratory or neurological events, and allergic reactions. Discussed the role of CRNA in patient's perioperative care. Patient understands.)       Anesthesia Quick Evaluation

## 2023-04-30 NOTE — Anesthesia Postprocedure Evaluation (Signed)
 Anesthesia Post Note  Patient: Diana Dillon  Procedure(s) Performed: COLONOSCOPY WITH PROPOFOL ESOPHAGOGASTRODUODENOSCOPY (EGD) WITH PROPOFOL BIOPSY SUBMUCOSAL TATTOO INJECTION  Patient location during evaluation: Endoscopy Anesthesia Type: General Level of consciousness: awake and alert Pain management: pain level controlled Vital Signs Assessment: post-procedure vital signs reviewed and stable Respiratory status: spontaneous breathing, nonlabored ventilation, respiratory function stable and patient connected to nasal cannula oxygen Cardiovascular status: blood pressure returned to baseline and stable Postop Assessment: no apparent nausea or vomiting Anesthetic complications: no  No notable events documented.   Last Vitals:  Vitals:   04/30/23 0650 04/30/23 0812  BP: 119/71 120/75  Pulse: 88   Resp: 20   Temp: (!) 36.3 C   SpO2: 99%     Last Pain:  Vitals:   04/30/23 0832  TempSrc:   PainSc: 2                  Stephanie Coup

## 2023-04-30 NOTE — Transfer of Care (Signed)
 Immediate Anesthesia Transfer of Care Note  Patient: Diana Dillon  Procedure(s) Performed: COLONOSCOPY WITH PROPOFOL ESOPHAGOGASTRODUODENOSCOPY (EGD) WITH PROPOFOL BIOPSY SUBMUCOSAL TATTOO INJECTION  Patient Location: Endoscopy Unit  Anesthesia Type:General  Level of Consciousness: drowsy and patient cooperative  Airway & Oxygen Therapy: Patient Spontanous Breathing and Patient connected to face mask oxygen  Post-op Assessment: Report given to RN and Post -op Vital signs reviewed and stable  Post vital signs: Reviewed and stable  Last Vitals:  Vitals Value Taken Time  BP 120/75 04/30/23 0816  Temp    Pulse 94 04/30/23 0819  Resp 21 04/30/23 0819  SpO2 100 % 04/30/23 0819  Vitals shown include unfiled device data.  Last Pain:  Vitals:   04/30/23 0812  TempSrc:   PainSc: Asleep         Complications: No notable events documented.

## 2023-04-30 NOTE — Interval H&P Note (Signed)
 History and Physical Interval Note: Preprocedure H&P from 04/30/23  was reviewed and there was no interval change after seeing and examining the patient.  Written consent was obtained from the patient after discussion of risks, benefits, and alternatives. 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  The various methods of treatment have been discussed with the patient and family. After consideration of risks, benefits and other options for treatment, the patient has consented to  Procedure(s): COLONOSCOPY WITH PROPOFOL (N/A) ESOPHAGOGASTRODUODENOSCOPY (EGD) WITH PROPOFOL (N/A) as a surgical intervention.  The patient's history has been reviewed, patient examined, no change in status, stable for surgery.  I have reviewed the patient's chart and labs.  Questions were answered to the patient's satisfaction.     Jaynie Collins

## 2023-04-30 NOTE — Op Note (Signed)
 [facility] Gastroenterology Patient Name: Diana Dillon Procedure Date: 04/30/2023 7:17 AM MRN: [id number] Account #: [id number] Date of Birth: 25-Oct-1955 Admit Type: Outpatient Age: 68 Room: [facility] ENDO ROOM 1 Gender: Female Note Status: Finalized Instrument Name: Upper Endoscope [id number] Procedure:             Upper GI endoscopy Indications:           Follow-up of intestinal metaplasia Providers:             Trenda Moots, DO Referring MD:          [facility] Medicines:             Monitored Anesthesia Care Complications:         No immediate complications. Estimated blood loss:                         Minimal. Procedure:             Pre-Anesthesia Assessment:                        - Prior to the procedure, a History and Physical was                         performed, and patient medications and allergies were                         reviewed. The patient is competent. The risks and                         benefits of the procedure and the sedation options and                         risks were discussed with the patient. All questions                         were answered and informed consent was obtained.                         Patient identification and proposed procedure were                         verified by the physician, the nurse, the anesthetist                         and the technician in the endoscopy suite. Mental                         Status Examination: alert and oriented. Airway                         Examination: normal oropharyngeal airway and neck                         mobility. Respiratory Examination: clear to                         auscultation. CV Examination: RRR, no murmurs, no S3  or S4. Prophylactic Antibiotics: The patient does not                         require prophylactic antibiotics. Prior                         Anticoagulants: The patient has taken no anticoagulant                          or antiplatelet agents. ASA Grade Assessment: III - A                         patient with severe systemic disease. After reviewing                         the risks and benefits, the patient was deemed in                         satisfactory condition to undergo the procedure. The                         anesthesia plan was to use monitored anesthesia care                         (MAC). Immediately prior to administration of                         medications, the patient was re-assessed for adequacy                         to receive sedatives. The heart rate, respiratory                         rate, oxygen saturations, blood pressure, adequacy of                         pulmonary ventilation, and response to care were                         monitored throughout the procedure. The physical                         status of the patient was re-assessed after the                         procedure.                        After obtaining informed consent, the endoscope was                         passed under direct vision. Throughout the procedure,                         the patient's blood pressure, pulse, and oxygen                         saturations were monitored continuously. The Endoscope  was introduced through the mouth, and advanced to the                         third part of duodenum. The upper GI endoscopy was                         accomplished without difficulty. The patient tolerated                         the procedure well. Findings:      The duodenal bulb, first portion of the duodenum, second portion of the       duodenum and third portion of the duodenum were normal. Estimated blood       loss: none.      Localized mild inflammation characterized by erythema and granularity       was found in the gastric antrum. Biopsies were taken with a cold forceps       for histology. Estimated blood loss was minimal. biopsies taken from        antrum and incisura in bottle 1. biopsies taken from the body in jar 2.      Multiple less than 5 mm sessile polyps with no bleeding and no stigmata       of recent bleeding were found in the gastric body. Estimated blood loss:       none. consistent with previously noted fundic gland polyps      The Z-line was variable. previosu biopsies negative for barretts       esophagus Estimated blood loss: none.      Esophagogastric landmarks were identified: the gastroesophageal junction       was found at 37 cm from the incisors.      The exam of the esophagus was otherwise normal. Impression:            - Normal duodenal bulb, first portion of the duodenum,                         second portion of the duodenum and third portion of                         the duodenum.                        - Gastritis. Biopsied.                        - Multiple gastric polyps.                        - Z-line variable.                        - Esophagogastric landmarks identified. Recommendation:        - Patient has a contact number available for                         emergencies. The signs and symptoms of potential                         delayed complications were discussed with the patient.  Return to normal activities tomorrow. Written                         discharge instructions were provided to the patient.                        - Discharge patient to home.                        - Resume previous diet.                        - Continue present medications.                        - Await pathology results.                        - Repeat upper endoscopy for surveillance based on                         pathology results.                        - Return to referring physician as previously                         scheduled.                        - proceed with colonoscopy. see report for further                         recommendations.                        - The  findings and recommendations were discussed with                         the patient. Procedure Code(s):     --- Professional ---                        [phone], Esophagogastroduodenoscopy, flexible,                         transoral; with biopsy, single or multiple Diagnosis Code(s):     --- Professional ---                        K29.70, Gastritis, unspecified, without bleeding                        K31.7, Polyp of stomach and duodenum                        K22.89, Other specified disease of esophagus                        K31.A0, Gastric intestinal metaplasia, unspecified CPT copyright 2022 American [employer] Association. All rights reserved. The codes documented in this report are preliminary and upon coder review may  be revised to meet current compliance requirements. Attending Participation:      I personally performed  the entire procedure. Elfredia Nevins, DO Jaynie Collins DO, DO 04/30/2023 7:46:31 AM This report has been signed electronically. Number of Addenda: 0 Note Initiated On: 04/30/2023 7:17 AM Estimated Blood Loss:  Estimated blood loss was minimal.      [facility]

## 2023-04-30 NOTE — H&P (Signed)
 Pre-Procedure H&P   Patient ID: Diana Dillon is a 68 y.o. female.  Gastroenterology Provider: Jaynie Collins, DO  Referring Provider: Jacob Moores, PA PCP: [facility]  Date: 04/30/2023  HPI Diana Dillon is a 68 y.o. female who presents today for Esophagogastroduodenoscopy and Colonoscopy for Gastric intestinal metaplasia and personal history of colon polyps .  Bowel movements have been regular without melena or hematochezia  Last underwent EGD and colonoscopy in April 2024.  EGD demonstrated focal gastric intestinal metaplasia on gastritis biopsies.  Negative for H. pylori.  Fundic gland polyps were also present.  Biopsies for EOE and Barrett's esophagus were negative  Her colonoscopy was poor prep but demonstrated 4 adenomatous polyps that were removed  Creatinine 1.8 hemoglobin 13.7 MCV 95 platelets 331,000 Her last dose of Wegovy was 2 weeks ago Status post hysterectomy   Past Medical History:  Diagnosis Date   Asthma    CHF (congestive heart failure) (HCC)    Chronic kidney disease    Diabetes mellitus without complication (HCC)    Dyspnea    GERD (gastroesophageal reflux disease)    Goiter    Herpes zoster    Hypertension    Hypothyroidism    Multiple acquired cysts of kidney    Thyroid cancer (HCC)    in remission   Uterine cancer (HCC)    in remission    Past Surgical History:  Procedure Laterality Date   ABDOMINAL HYSTERECTOMY     CARPAL TUNNEL RELEASE Right 08/06/2021   Procedure: CARPAL TUNNEL RELEASE ENDOSCOPIC;  Surgeon: Christena Flake, MD;  Location: [facility] ORS;  Service: Orthopedics;  Laterality: Right;   CESAREAN SECTION     x 2   COLONOSCOPY N/A 06/15/2022   Procedure: COLONOSCOPY;  Surgeon: Jaynie Collins, DO;  Location: [facility] ENDOSCOPY;  Service: Gastroenterology;  Laterality: N/A;   ESOPHAGOGASTRODUODENOSCOPY N/A 06/15/2022   Procedure: ESOPHAGOGASTRODUODENOSCOPY (EGD);  Surgeon: Jaynie Collins,  DO;  Location: [facility] ENDOSCOPY;  Service: Gastroenterology;  Laterality: N/A;   THYROIDECTOMY Right    partial   THYROIDECTOMY Left    REMOVING RESIDULE TISSUE    Family History No h/o GI disease or malignancy  Review of Systems  Constitutional:  Negative for activity change, appetite change, chills, diaphoresis, fatigue, fever and unexpected weight change.  HENT:  Negative for trouble swallowing and voice change.   Respiratory:  Negative for shortness of breath and wheezing.   Cardiovascular:  Negative for chest pain, palpitations and leg swelling.  Gastrointestinal:  Negative for abdominal distention, abdominal pain, anal bleeding, blood in stool, constipation, diarrhea, nausea, rectal pain and vomiting.  Musculoskeletal:  Negative for arthralgias and myalgias.  Skin:  Negative for color change and pallor.  Neurological:  Negative for dizziness, syncope and weakness.  Psychiatric/Behavioral:  Negative for confusion.   All other systems reviewed and are negative.    Medications No current facility-administered medications on file prior to encounter.   Current Outpatient Medications on File Prior to Encounter  Medication Sig Dispense Refill   acetaminophen (TYLENOL) 500 MG tablet Take by mouth.     diclofenac Sodium (VOLTAREN) 1 % GEL Apply 1/2 to 1 inch strip to hand and massage thoroughly TID     diphenhydramine-acetaminophen (TYLENOL PM EXTRA STRENGTH) 25-500 MG TABS tablet Take 2 tablets by mouth at bedtime as needed.     fluticasone (FLONASE) 50 MCG/ACT nasal spray Place into the nose.     furosemide (LASIX) 20 MG tablet Take by  mouth as needed.     gabapentin (NEURONTIN) 300 MG capsule Take 300 mg by mouth daily.     glucose blood (PRECISION QID TEST) test strip Use 3 (three) times daily Use as instructed.     JARDIANCE 10 MG TABS tablet Take 10 mg by mouth daily.     levothyroxine (SYNTHROID, LEVOTHROID) 75 MCG tablet Take 75 mcg by mouth daily before breakfast.      montelukast (SINGULAIR) 10 MG tablet Take 10 mg by mouth at bedtime.     omeprazole (PRILOSEC) 20 MG capsule Take 10 mg by mouth daily.     ondansetron (ZOFRAN-ODT) 4 MG disintegrating tablet Take 1 tablet (4 mg total) by mouth every 8 (eight) hours as needed for nausea or vomiting. 20 tablet 0   phentermine (ADIPEX-P) 37.5 MG tablet Take 37.5 mg by mouth every morning.     SYMBICORT 160-4.5 MCG/ACT inhaler SMARTSIG:2 Puff(s) By Mouth Twice Daily     telmisartan (MICARDIS) 80 MG tablet Take 80 mg by mouth daily.     albuterol (PROVENTIL HFA;VENTOLIN HFA) 108 (90 BASE) MCG/ACT inhaler Inhale into the lungs every 6 (six) hours as needed for wheezing or shortness of breath.     dicyclomine (BENTYL) 20 MG tablet Take 1 tablet (20 mg total) by mouth daily for 10 days. 10 tablet 0    Pertinent medications related to GI and procedure were reviewed by me with the patient prior to the procedure   Current Facility-Administered Medications:    0.9 %  sodium chloride infusion, , Intravenous, Continuous, Jaynie Collins, DO, Last Rate: 20 mL/hr at 04/30/23 1610, 20 mL/hr at 04/30/23 9604  sodium chloride 20 mL/hr (04/30/23 0704)       Allergies  Allergen Reactions   Codeine Hives    Lightheadedness  Other reaction(s): Hyperactive Behavior, Not Indicated, Other (see comments), Rash-Hives Reports blood clots/fever/chills Lightheadedness  Reports blood clots/fever/chills    Amlodipine Swelling   Atorvastatin     Other Reaction(s): Muscle Pain   Rosuvastatin     Other Reaction(s): Muscle Pain   Allergies were reviewed by me prior to the procedure  Objective   Body mass index is 37.14 kg/m. Vitals:   04/30/23 0650  BP: 119/71  Pulse: 88  Resp: 20  Temp: (!) 97.3 F (36.3 C)  TempSrc: Temporal  SpO2: 99%  Weight: 91.4 kg  Height: 5' 1.75" (1.568 m)     Physical Exam Vitals and nursing note reviewed.  Constitutional:      General: She is not in acute distress.     Appearance: Normal appearance. She is not ill-appearing, toxic-appearing or diaphoretic.  HENT:     Head: Normocephalic and atraumatic.     Nose: Nose normal.     Mouth/Throat:     Mouth: Mucous membranes are moist.     Pharynx: Oropharynx is clear.  Eyes:     General: No scleral icterus.    Extraocular Movements: Extraocular movements intact.  Cardiovascular:     Rate and Rhythm: Normal rate and regular rhythm.     Heart sounds: Normal heart sounds. No murmur heard.    No friction rub. No gallop.  Pulmonary:     Effort: Pulmonary effort is normal. No respiratory distress.     Breath sounds: Normal breath sounds. No wheezing, rhonchi or rales.  Abdominal:     General: Abdomen is flat. Bowel sounds are normal. There is no distension.     Palpations: Abdomen is soft.  Tenderness: There is no abdominal tenderness. There is no guarding or rebound.  Musculoskeletal:     Cervical back: Neck supple.     Right lower leg: No edema.     Left lower leg: No edema.  Skin:    General: Skin is warm and dry.     Coloration: Skin is not jaundiced or pale.  Neurological:     General: No focal deficit present.     Mental Status: She is alert and oriented to person, place, and time. Mental status is at baseline.  Psychiatric:        Mood and Affect: Mood normal.        Behavior: Behavior normal.        Thought Content: Thought content normal.        Judgment: Judgment normal.      Assessment:  Ms. TACIE MCCUISTION is a 68 y.o. female  who presents today for Colonoscopy for Gastric intestinal metaplasia and personal history of colon polyps .  Plan:  Colonoscopy with possible intervention today  Colonoscopy with possible biopsy, control of bleeding, polypectomy, and interventions as necessary has been discussed with the patient/patient representative. Informed consent was obtained from the patient/patient representative after explaining the indication, nature, and risks of the procedure  including but not limited to death, bleeding, perforation, missed neoplasm/lesions, cardiorespiratory compromise, and reaction to medications. Opportunity for questions was given and appropriate answers were provided. Patient/patient representative has verbalized understanding is amenable to undergoing the procedure.   Jaynie Collins, DO  [facility] Gastroenterology  Portions of the record may have been created with voice recognition software. Occasional wrong-word or 'sound-a-like' substitutions may have occurred due to the inherent limitations of voice recognition software.  Read the chart carefully and recognize, using context, where substitutions may have occurred.

## 2023-05-03 ENCOUNTER — Encounter: Payer: Self-pay | Admitting: Gastroenterology

## 2023-05-03 LAB — SURGICAL PATHOLOGY

## 2023-08-16 ENCOUNTER — Ambulatory Visit: Admission: EM | Admit: 2023-08-16 | Discharge: 2023-08-16 | Disposition: A | Discharge status: Home / Self Care

## 2023-08-16 ENCOUNTER — Encounter: Payer: Self-pay | Admitting: *Deleted

## 2023-08-16 DIAGNOSIS — L03113 Cellulitis of right upper limb: Secondary | ICD-10-CM

## 2023-08-16 MED ORDER — SULFAMETHOXAZOLE-TRIMETHOPRIM 800-160 MG PO TABS: 1.0000 | ORAL_TABLET | Freq: Two times a day (BID) | ORAL | 0 refills | Status: AC

## 2023-08-16 NOTE — ED Provider Notes (Signed)
 [facility]    CSN: [id number] Arrival date & time: 08/16/23  1619      History   Chief Complaint Chief Complaint  Patient presents with   arm wound    HPI Diana Dillon is a 68 y.o. female.   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  She states that the area has been red the entire time but this morning she noticed bumps develop and when she squeezed them pus discharged from the site.  She describes the area as being both itchy and painful.  She recently had a colonoscopy with a large polyp removal and she is concerned for infection.  She denies any fever at home.  Past Medical History:  Diagnosis Date   Asthma    CHF (congestive heart failure) (HCC)    Chronic kidney disease    Diabetes mellitus without complication (HCC)    Dyspnea    GERD (gastroesophageal reflux disease)    Goiter    Herpes zoster    Hypertension    Hypothyroidism    Multiple acquired cysts of kidney    Thyroid  cancer (HCC)    in remission   Uterine cancer (HCC)    in remission    Patient Active Problem List   Diagnosis Date Noted   Numbness and tingling in right hand 05/10/2021   Chronic kidney disease, stage 3a (HCC) 09/11/2020   Heart failure, unspecified (HCC) 09/11/2020   Chronic heart failure with preserved ejection fraction (HCC) 12/24/2019   Obesity, Class III, BMI 40-49.9 (morbid obesity) 11/14/2019   Dysuria 10/14/2018   Vitamin D deficiency 11/02/2017   Prediabetes 01/23/2016   H/O thyroidectomy 12/10/2014   History of uterine cancer 12/10/2014   H/O: hysterectomy 12/10/2014   H/O: C-section 12/10/2014   Asthma 12/10/2014   Moderate persistent asthma without complication 12/10/2014   Thyroid   nodule 03/10/2012   Hypercholesterolemia 02/29/2012   Essential (primary) hypertension 01/27/2012   GERD (gastroesophageal reflux disease) 01/27/2012   Tobacco use 01/27/2012    Past Surgical History:  Procedure Laterality Date   ABDOMINAL HYSTERECTOMY     BIOPSY  04/30/2023   Procedure: BIOPSY;  Surgeon: Quintin Buckle, DO;  Location: [facility] ENDOSCOPY;  Service: Gastroenterology;;   Ritta Chessman RELEASE Right 08/06/2021   Procedure: CARPAL TUNNEL RELEASE ENDOSCOPIC;  Surgeon: Elner Hahn, MD;  Location: [facility] ORS;  Service: Orthopedics;  Laterality: Right;   CESAREAN SECTION     x 2   COLONOSCOPY N/A 06/15/2022   Procedure: COLONOSCOPY;  Surgeon: Quintin Buckle, DO;  Location: [facility] ENDOSCOPY;  Service: Gastroenterology;  Laterality: N/A;   COLONOSCOPY WITH PROPOFOL  N/A 04/30/2023   Procedure: COLONOSCOPY WITH PROPOFOL ;  Surgeon: Quintin Buckle, DO;  Location: [facility] ENDOSCOPY;  Service: Gastroenterology;  Laterality: N/A;   ESOPHAGOGASTRODUODENOSCOPY N/A 06/15/2022   Procedure: ESOPHAGOGASTRODUODENOSCOPY (EGD);  Surgeon: Quintin Buckle, DO;  Location: [facility] ENDOSCOPY;  Service: Gastroenterology;  Laterality: N/A;   ESOPHAGOGASTRODUODENOSCOPY (EGD) WITH PROPOFOL  N/A 04/30/2023   Procedure: ESOPHAGOGASTRODUODENOSCOPY (EGD) WITH PROPOFOL ;  Surgeon: Quintin Buckle, DO;  Location: [facility] ENDOSCOPY;  Service: Gastroenterology;  Laterality: N/A;   SUBMUCOSAL TATTOO INJECTION  04/30/2023   Procedure: SUBMUCOSAL TATTOO INJECTION;  Surgeon: Quintin Buckle, DO;  Location: [facility] ENDOSCOPY;  Service: Gastroenterology;;   Danelle Dunning  Right    partial   THYROIDECTOMY Left    REMOVING RESIDULE TISSUE    OB History     Gravida  3   Para  3   Term      Preterm      AB      Living         SAB      IAB      Ectopic      Multiple      Live Births               Home Medications    Prior to Admission medications   Medication Sig Start  Date End Date Taking? Authorizing Provider  sulfamethoxazole -trimethoprim  (BACTRIM  DS) 800-160 MG tablet Take 1 tablet by mouth 2 (two) times daily for 7 days. 08/16/23 08/23/23 Yes Kent Pear, NP  acetaminophen  (TYLENOL ) 500 MG tablet Take by mouth.    [provider]  albuterol  (PROVENTIL  HFA;VENTOLIN  HFA) 108 (90 BASE) MCG/ACT inhaler Inhale into the lungs every 6 (six) hours as needed for wheezing or shortness of breath.    [provider]  diclofenac Sodium (VOLTAREN) 1 % GEL Apply 1/2 to 1 inch strip to hand and massage thoroughly TID 12/27/18   [provider]  dicyclomine  (BENTYL ) 20 MG tablet Take 1 tablet (20 mg total) by mouth daily for 10 days. 09/13/21 09/23/21  [name], NP  diphenhydramine-acetaminophen  (TYLENOL  PM EXTRA STRENGTH) 25-500 MG TABS tablet Take 2 tablets by mouth at bedtime as needed.    [provider]  fluticasone (FLONASE) 50 MCG/ACT nasal spray Place into the nose.    [provider]  furosemide (LASIX) 20 MG tablet Take by mouth as needed. 09/25/19   [provider]  gabapentin (NEURONTIN) 300 MG capsule Take 300 mg by mouth daily. 06/22/20   [provider]  glucose blood (PRECISION QID TEST) test strip Use 3 (three) times daily Use as instructed. 11/14/19   [provider]  JARDIANCE 10 MG TABS tablet Take 10 mg by mouth daily. 10/01/20   [provider]  levothyroxine (SYNTHROID, LEVOTHROID) 75 MCG tablet Take 75 mcg by mouth daily before breakfast.    [provider]  meclizine  (ANTIVERT ) 25 MG tablet Take 1 tablet (25 mg total) by mouth 3 (three) times daily as needed for dizziness. 04/21/23   Kent Pear, NP  montelukast (SINGULAIR) 10 MG tablet Take 10 mg by mouth at bedtime. 05/21/21   [provider]  omeprazole (PRILOSEC) 20 MG capsule Take 10 mg by mouth daily.    [provider]  ondansetron  (ZOFRAN -ODT) 4 MG disintegrating tablet Take 1 tablet (4 mg  total) by mouth every 8 (eight) hours as needed for nausea or vomiting. 09/13/21   [name], NP  phentermine (ADIPEX-P) 37.5 MG tablet Take 37.5 mg by mouth every morning. 09/16/20   [provider]  Semaglutide-Weight Management 2.4 MG/0.75ML SOAJ Inject into the skin. 02/09/23   [provider]  spironolactone (ALDACTONE) 25 MG tablet Take 25 mg by mouth daily.    [provider]  SYMBICORT 160-4.5 MCG/ACT inhaler SMARTSIG:2 Puff(s) By Mouth Twice Daily 07/04/20   [provider]  telmisartan (MICARDIS) 80 MG tablet Take 80 mg by mouth daily. 05/20/21   [provider]    Family History Family History  Problem Relation Age of Onset   Alzheimer's disease Mother    Bone cancer Father     Social History Social History  Tobacco Use   Smoking status: Every Day    Current packs/day: 1.00    Average packs/day: 1 pack/day for 30.0 years (30.0 ttl pk-yrs)    Types: Cigarettes   Smokeless tobacco: Never  Vaping Use   Vaping status: Never Used  Substance Use Topics   Alcohol use: Yes    Comment: occasionally   Drug use: No     Allergies   Codeine, Amlodipine, Atorvastatin, and Rosuvastatin   Review of Systems Review of Systems  Constitutional:  Negative for fever.  Skin:  Positive for color change and wound.     Physical Exam Triage Vital Signs ED Triage Vitals  Encounter Vitals Group     BP 08/16/23 1649 122/76     Systolic BP Percentile --      Diastolic BP Percentile --      Pulse Rate 08/16/23 1649 82     Resp 08/16/23 1649 20     Temp 08/16/23 1649 99.1 F (37.3 C)     Temp Source 08/16/23 1649 Oral     SpO2 08/16/23 1649 96 %     Weight 08/16/23 1645 185 lb (83.9 kg)     Height 08/16/23 1645 5' 1.25" (1.556 m)     Head Circumference --      Peak Flow --      Pain Score 08/16/23 1645 2     Pain Loc --      Pain Education --      Exclude from Growth Chart --    No data found.  Updated Vital Signs BP  122/76 (BP Location: Left Arm)   Pulse 82   Temp 99.1 F (37.3 C) (Oral)   Resp 20   Ht 5' 1.25" (1.556 m)   Wt 185 lb (83.9 kg)   SpO2 96%   BMI 34.67 kg/m   Visual Acuity Right Eye Distance:   Left Eye Distance:   Bilateral Distance:    Right Eye Near:   Left Eye Near:    Bilateral Near:     Physical Exam Vitals and nursing note reviewed.  Constitutional:      Appearance: Normal appearance. She is not ill-appearing.  HENT:     Head: Normocephalic and atraumatic.  Skin:    General: Skin is warm and dry.     Capillary Refill: Capillary refill takes less than 2 seconds.     Findings: Erythema present.  Neurological:     General: No focal deficit present.     Mental Status: She is alert and oriented to person, place, and time.      UC Treatments / Results  Labs (all labs ordered are listed, but only abnormal results are displayed) Labs Reviewed - No data to display  EKG   Radiology No results found.  Procedures Procedures (including critical care time)  Medications Ordered in UC Medications - No data to display  Initial Impression / Assessment and Plan / UC Course  I have reviewed the triage vital signs and the nursing notes.  Pertinent labs & imaging results that were available during my care of the patient were reviewed by me and considered in my medical decision making (see chart for details).   Patient is a pleasant, nontoxic-appearing 68 year old female presenting for evaluation of possible infection from a surgical excision site on her right forearm.  The patient had a large squamous carcinoma removed from her right forearm and had internal sutures placed.  She reports that this incision has always been a  little bit red but today she noticed bumps develop that are expressing pus.  Patient seen image above, there is a large collection of pus at the distal end of the wound and the wound is erythematous.  It is also warm and tender to touch.  This could  possibly be the body's reaction to the internal sutures breaking down, however, I will treat the patient for potential developing cellulitis with Bactrim  DS.  Most recent creatinine available in our system is from January 2025 and at that time her creatinine was 1.7.  Calculated creatinine clearance is 31 mL/min which does not require any a dosage adjustment to the Bactrim .  I will send her home on a 7-day course and have her continue to monitor the erythema and monitor for any increasing fever.  Patient does have an elevated temp of 99.1 here in clinic.   Final Clinical Impressions(s) / UC Diagnoses   Final diagnoses:  Cellulitis of right upper extremity     Discharge Instructions      Take the Bactrim  DS twice daily with a full glass of water for 7 days for treatment of potential wound infection to your right forearm.  Continue to monitor the redness on your forearm for any increase, red streaks up your arm, increased swelling, or if you continue to have elevation of your body temperature.  You may use over-the-counter Tylenol  according to the package instructions as needed for pain or fever.  If the conditions listed above present themselves then please return for reevaluation or seek care in the emergency department.   ED Prescriptions     Medication Sig Dispense Auth. Provider   sulfamethoxazole -trimethoprim  (BACTRIM  DS) 800-160 MG tablet Take 1 tablet by mouth 2 (two) times daily for 7 days. 14 tablet Kent Pear, NP      PDMP not reviewed this encounter.   Kent Pear, NP 08/16/23 1731

## 2023-08-16 NOTE — Discharge Instructions (Signed)
 Take the Bactrim  DS twice daily with a full glass of water for 7 days for treatment of potential wound infection to your right forearm.  Continue to monitor the redness on your forearm for any increase, red streaks up your arm, increased swelling, or if you continue to have elevation of your body temperature.  You may use over-the-counter Tylenol  according to the package instructions as needed for pain or fever.  If the conditions listed above present themselves then please return for reevaluation or seek care in the emergency department.

## 2023-08-16 NOTE — ED Triage Notes (Signed)
 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  It is painful and itchy.  6/5 had colonoscopy w/ resection and is c/f infection

## 2023-09-24 ENCOUNTER — Other Ambulatory Visit: Payer: Self-pay | Admitting: Family Medicine

## 2023-09-24 DIAGNOSIS — Z1231 Encounter for screening mammogram for malignant neoplasm of breast: Secondary | ICD-10-CM

## 2023-11-05 ENCOUNTER — Encounter: Payer: Self-pay | Admitting: Emergency Medicine

## 2023-11-05 ENCOUNTER — Ambulatory Visit
Admission: EM | Admit: 2023-11-05 | Discharge: 2023-11-05 | Disposition: A | Discharge status: Home / Self Care | Attending: Emergency Medicine | Admitting: Emergency Medicine

## 2023-11-05 DIAGNOSIS — Z20822 Contact with and (suspected) exposure to covid-19: Secondary | ICD-10-CM | POA: Insufficient documentation

## 2023-11-05 DIAGNOSIS — R112 Nausea with vomiting, unspecified: Secondary | ICD-10-CM | POA: Diagnosis present

## 2023-11-05 DIAGNOSIS — B349 Viral infection, unspecified: Secondary | ICD-10-CM | POA: Insufficient documentation

## 2023-11-05 LAB — SARS CORONAVIRUS 2 BY RT PCR: SARS Coronavirus 2 by RT PCR: NEGATIVE

## 2023-11-05 MED ORDER — ONDANSETRON 4 MG PO TBDP
4.0000 mg | ORAL_TABLET | Freq: Once | ORAL | Status: AC
  Administered 2023-11-05: 4 mg via ORAL

## 2023-11-05 NOTE — Discharge Instructions (Signed)
 Your covid test is negative Rest,push fluids, avoid caffeine If you have worsening symptoms(chest pain, shortness of breath,palpitations, unable to keep fluids down,  etc) go to Er for further evaluation

## 2023-11-05 NOTE — ED Triage Notes (Signed)
 Patient states that earlier today, she started not to feel good.  Patient states that she started coughing, nausea, and headache.  Patient reports chills.  Patient unsure of fevers. Patient has been around COVID.  Patient works at a school.

## 2023-11-05 NOTE — ED Provider Notes (Signed)
 [facility]    CSN: [id number] Arrival date & time: 11/05/23  1702      History   Chief Complaint Chief Complaint  Patient presents with   Cough   Headache    HPI Diana Dillon is a 68 y.o. female.   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 Pt eat breakfast has been drinking water, vomited once while on way to office.   The history is provided by the patient. No language interpreter was used.    Past Medical History:  Diagnosis Date   Asthma    CHF (congestive heart failure) (HCC)    Chronic kidney disease    Diabetes mellitus without complication (HCC)    Dyspnea    GERD (gastroesophageal reflux disease)    Goiter    Herpes zoster    Hypertension    Hypothyroidism    Multiple acquired cysts of kidney    Thyroid  cancer (HCC)    in remission   Uterine cancer (HCC)    in remission    Patient Active Problem List   Diagnosis Date Noted   Nonspecific syndrome suggestive of viral illness 11/05/2023   Exposure to COVID-19 virus 11/05/2023   Nausea and vomiting 11/05/2023   Numbness and tingling in right hand 05/10/2021   Chronic kidney disease, stage 3a (HCC) 09/11/2020   Heart failure, unspecified (HCC) 09/11/2020   Chronic heart failure with preserved ejection fraction (HCC) 12/24/2019   Obesity, Class III, BMI 40-49.9 (morbid obesity) 11/14/2019   Dysuria 10/14/2018   Vitamin D deficiency 11/02/2017   Prediabetes 01/23/2016   H/O thyroidectomy 12/10/2014   History of uterine cancer 12/10/2014   H/O: hysterectomy 12/10/2014   H/O: C-section 12/10/2014   Asthma 12/10/2014   Moderate persistent asthma without complication 12/10/2014   Thyroid  nodule 03/10/2012   Hypercholesterolemia 02/29/2012   Essential (primary) hypertension 01/27/2012   GERD (gastroesophageal reflux disease) 01/27/2012   Tobacco use  01/27/2012    Past Surgical History:  Procedure Laterality Date   ABDOMINAL HYSTERECTOMY     BIOPSY  04/30/2023   Procedure: BIOPSY;  Surgeon: Onita Elspeth Sharper, DO;  Location: [facility] ENDOSCOPY;  Service: Gastroenterology;;   ORIN MEDIATE RELEASE Right 08/06/2021   Procedure: CARPAL TUNNEL RELEASE ENDOSCOPIC;  Surgeon: Edie Norleen PARAS, MD;  Location: [facility] ORS;  Service: Orthopedics;  Laterality: Right;   CESAREAN SECTION     x 2   COLONOSCOPY N/A 06/15/2022   Procedure: COLONOSCOPY;  Surgeon: Onita Elspeth Sharper, DO;  Location: [facility] ENDOSCOPY;  Service: Gastroenterology;  Laterality: N/A;   COLONOSCOPY WITH PROPOFOL  N/A 04/30/2023   Procedure: COLONOSCOPY WITH PROPOFOL ;  Surgeon: Onita Elspeth Sharper, DO;  Location: [facility] ENDOSCOPY;  Service: Gastroenterology;  Laterality: N/A;   ESOPHAGOGASTRODUODENOSCOPY N/A 06/15/2022   Procedure: ESOPHAGOGASTRODUODENOSCOPY (EGD);  Surgeon: Onita Elspeth Sharper, DO;  Location: [facility] ENDOSCOPY;  Service: Gastroenterology;  Laterality: N/A;   ESOPHAGOGASTRODUODENOSCOPY (EGD) WITH PROPOFOL  N/A 04/30/2023   Procedure: ESOPHAGOGASTRODUODENOSCOPY (EGD) WITH PROPOFOL ;  Surgeon: Onita Elspeth Sharper, DO;  Location: [facility] ENDOSCOPY;  Service: Gastroenterology;  Laterality: N/A;   SUBMUCOSAL TATTOO INJECTION  04/30/2023   Procedure: SUBMUCOSAL TATTOO INJECTION;  Surgeon: Onita Elspeth Sharper, DO;  Location: [facility] ENDOSCOPY;  Service: Gastroenterology;;   THYROIDECTOMY Right    partial   THYROIDECTOMY Left    REMOVING RESIDULE TISSUE    OB History     Gravida  3  Para  3   Term      Preterm      AB      Living         SAB      IAB      Ectopic      Multiple      Live Births               Home Medications    Prior to Admission medications   Medication Sig Start Date End Date Taking? Authorizing Provider  acetaminophen  (TYLENOL ) 500 MG tablet Take by mouth.    [provider]  albuterol  (PROVENTIL  HFA;VENTOLIN  HFA) 108  (90 BASE) MCG/ACT inhaler Inhale into the lungs every 6 (six) hours as needed for wheezing or shortness of breath.    [provider]  diphenhydramine-acetaminophen  (TYLENOL  PM EXTRA STRENGTH) 25-500 MG TABS tablet Take 2 tablets by mouth at bedtime as needed.    [provider]  fluticasone (FLONASE) 50 MCG/ACT nasal spray Place into the nose.    [provider]  furosemide (LASIX) 20 MG tablet Take by mouth as needed. 09/25/19   [provider]  gabapentin (NEURONTIN) 300 MG capsule Take 300 mg by mouth daily. 06/22/20   [provider]  glucose blood (PRECISION QID TEST) test strip Use 3 (three) times daily Use as instructed. 11/14/19   [provider]  JARDIANCE 10 MG TABS tablet Take 10 mg by mouth daily. 10/01/20   [provider]  levothyroxine (SYNTHROID, LEVOTHROID) 75 MCG tablet Take 75 mcg by mouth daily before breakfast.    [provider]  montelukast (SINGULAIR) 10 MG tablet Take 10 mg by mouth at bedtime. 05/21/21   [provider]  omeprazole (PRILOSEC) 20 MG capsule Take 10 mg by mouth daily.    [provider]  Semaglutide-Weight Management 2.4 MG/0.75ML SOAJ Inject into the skin. 02/09/23   [provider]  spironolactone (ALDACTONE) 25 MG tablet Take 25 mg by mouth daily.    [provider]  SYMBICORT 160-4.5 MCG/ACT inhaler SMARTSIG:2 Puff(s) By Mouth Twice Daily 07/04/20   [provider]  telmisartan (MICARDIS) 80 MG tablet Take 80 mg by mouth daily. 05/20/21   [provider]    Family History Family History  Problem Relation Age of Onset   Alzheimer's disease Mother    Bone cancer Father     Social History Social History   Tobacco Use   Smoking status: Every Day    Current packs/day: 1.00    Average packs/day: 1 pack/day for 30.0 years (30.0 ttl pk-yrs)    Types: Cigarettes   Smokeless tobacco: Never  Vaping Use   Vaping status: Never Used   Substance Use Topics   Alcohol use: Yes    Comment: occasionally   Drug use: No     Allergies   Codeine, Amlodipine, Atorvastatin, and Rosuvastatin   Review of Systems Review of Systems  Constitutional:  Positive for chills. Negative for fever.  Respiratory:  Positive for cough.   Gastrointestinal:  Positive for nausea and vomiting.  All other systems reviewed and are negative.    Physical Exam Triage Vital Signs ED Triage Vitals  Encounter Vitals Group     BP      Girls Systolic BP Percentile      Girls Diastolic BP Percentile      Boys Systolic BP Percentile      Boys Diastolic BP Percentile      Pulse  Resp      Temp      Temp src      SpO2      Weight      Height      Head Circumference      Peak Flow      Pain Score      Pain Loc      Pain Education      Exclude from Growth Chart    No data found.  Updated Vital Signs BP 119/80 (BP Location: Right Arm)   Pulse 78   Temp 98.5 F (36.9 C) (Oral)   Resp 14   Ht 5' 1 (1.549 m)   Wt 184 lb 15.5 oz (83.9 kg)   SpO2 97%   BMI 34.95 kg/m   Visual Acuity Right Eye Distance:   Left Eye Distance:   Bilateral Distance:    Right Eye Near:   Left Eye Near:    Bilateral Near:     Physical Exam Vitals and nursing note reviewed.  Constitutional:      General: She is not in acute distress.    Appearance: She is well-developed.  HENT:     Head: Normocephalic.     Right Ear: Tympanic membrane is retracted.     Left Ear: Tympanic membrane is retracted.     Nose: Congestion present.     Mouth/Throat:     Lips: Pink.     Mouth: Mucous membranes are moist.     Pharynx: Oropharynx is clear.  Eyes:     General: Lids are normal.     Conjunctiva/sclera: Conjunctivae normal.     Pupils: Pupils are equal, round, and reactive to light.  Neck:     Trachea: No tracheal deviation.  Cardiovascular:     Rate and Rhythm: Normal rate and regular rhythm.     Pulses: Normal pulses.     Heart sounds: Normal  heart sounds. No murmur heard. Pulmonary:     Effort: Pulmonary effort is normal.     Breath sounds: Normal breath sounds and air entry.  Abdominal:     General: Bowel sounds are normal.     Palpations: Abdomen is soft.     Tenderness: There is no abdominal tenderness. There is no guarding or rebound.  Musculoskeletal:        General: Normal range of motion.     Cervical back: Normal range of motion.  Lymphadenopathy:     Cervical: No cervical adenopathy.  Skin:    General: Skin is warm and dry.     Findings: No rash.  Neurological:     General: No focal deficit present.     Mental Status: She is alert and oriented to person, place, and time.     GCS: GCS eye subscore is 4. GCS verbal subscore is 5. GCS motor subscore is 6.  Psychiatric:        Attention and Perception: Attention normal.        Mood and Affect: Mood normal.        Speech: Speech normal.        Behavior: Behavior normal. Behavior is cooperative.      UC Treatments / Results  Labs (all labs ordered are listed, but only abnormal results are displayed) Labs Reviewed  SARS CORONAVIRUS 2 BY RT PCR    EKG   Radiology No results found.  Procedures Procedures (including critical care time)  Medications Ordered in UC Medications  ondansetron  (ZOFRAN -ODT) disintegrating tablet 4 mg (  4 mg Oral Given 11/05/23 1729)    Initial Impression / Assessment and Plan / UC Course  I have reviewed the triage vital signs and the nursing notes.  Pertinent labs & imaging results that were available during my care of the patient were reviewed by me and considered in my medical decision making (see chart for details).  Clinical Course as of 11/05/23 1913  Fri Nov 05, 2023  1743 Zofran  for nausea, covid ordered [JD]    Clinical Course User Index [JD] Domini Vandehei, Rilla, NP   Discussed exam findings and plan of care with patient, strict go to ER precautions given.   Patient verbalized understanding to this  provider.  Ddx: Viral illness, nausea, vomiting, gastroenteritis, Covid exposure Final Clinical Impressions(s) / UC Diagnoses   Final diagnoses:  Nonspecific syndrome suggestive of viral illness  Exposure to COVID-19 virus  Nausea and vomiting, unspecified vomiting type     Discharge Instructions      Your covid test is negative Rest,push fluids, avoid caffeine If you have worsening symptoms(chest pain, shortness of breath,palpitations, unable to keep fluids down,  etc) go to Er for further evaluation     ED Prescriptions   None    PDMP not reviewed this encounter.   Aminta Rilla, NP 11/05/23 [phone]
